# Patient Record
Sex: Female | Born: 1940 | Hispanic: Yes | Marital: Married | Smoking: Never smoker
Health system: Southern US, Community
[De-identification: ages and names within clinical notes are randomized; demographics above are authoritative.]

## PROBLEM LIST (undated history)

## (undated) ENCOUNTER — Emergency Department (HOSPITAL_COMMUNITY): Source: Home / Self Care

## (undated) DIAGNOSIS — M1990 Unspecified osteoarthritis, unspecified site: Secondary | ICD-10-CM

## (undated) DIAGNOSIS — M810 Age-related osteoporosis without current pathological fracture: Secondary | ICD-10-CM

## (undated) DIAGNOSIS — I10 Essential (primary) hypertension: Secondary | ICD-10-CM

## (undated) DIAGNOSIS — Z972 Presence of dental prosthetic device (complete) (partial): Secondary | ICD-10-CM

## (undated) DIAGNOSIS — E785 Hyperlipidemia, unspecified: Secondary | ICD-10-CM

## (undated) HISTORY — PX: CHOLECYSTECTOMY: SHX55

## (undated) HISTORY — DX: Essential (primary) hypertension: I10

## (undated) HISTORY — DX: Age-related osteoporosis without current pathological fracture: M81.0

## (undated) HISTORY — PX: VARICOSE VEIN SURGERY: SHX832

---

## 1998-12-01 HISTORY — PX: COLONOSCOPY: SHX174

## 2009-07-17 DIAGNOSIS — M8580 Other specified disorders of bone density and structure, unspecified site: Secondary | ICD-10-CM | POA: Insufficient documentation

## 2009-07-17 DIAGNOSIS — M154 Erosive (osteo)arthritis: Secondary | ICD-10-CM | POA: Insufficient documentation

## 2012-06-29 ENCOUNTER — Other Ambulatory Visit (HOSPITAL_COMMUNITY): Payer: Self-pay | Admitting: Nurse Practitioner

## 2012-06-29 DIAGNOSIS — N6452 Nipple discharge: Secondary | ICD-10-CM

## 2012-06-29 DIAGNOSIS — Z139 Encounter for screening, unspecified: Secondary | ICD-10-CM

## 2012-07-05 ENCOUNTER — Ambulatory Visit (HOSPITAL_COMMUNITY): Payer: Self-pay

## 2012-07-13 ENCOUNTER — Ambulatory Visit (HOSPITAL_COMMUNITY)
Admission: RE | Admit: 2012-07-13 | Discharge: 2012-07-13 | Disposition: A | Discharge status: Home / Self Care | Payer: Self-pay | Source: Ambulatory Visit | Attending: Nurse Practitioner | Admitting: Nurse Practitioner

## 2012-07-13 DIAGNOSIS — Z139 Encounter for screening, unspecified: Secondary | ICD-10-CM

## 2012-07-21 ENCOUNTER — Encounter (HOSPITAL_COMMUNITY): Payer: Self-pay

## 2014-08-28 ENCOUNTER — Encounter: Payer: Self-pay | Admitting: *Deleted

## 2014-09-20 ENCOUNTER — Ambulatory Visit: Payer: Self-pay | Admitting: General Surgery

## 2014-11-13 ENCOUNTER — Encounter: Payer: Self-pay | Admitting: *Deleted

## 2014-11-13 ENCOUNTER — Ambulatory Visit (INDEPENDENT_AMBULATORY_CARE_PROVIDER_SITE_OTHER): Payer: Medicare Other | Admitting: General Surgery

## 2014-11-13 ENCOUNTER — Other Ambulatory Visit: Payer: Self-pay | Admitting: *Deleted

## 2014-11-13 ENCOUNTER — Encounter: Payer: Self-pay | Admitting: General Surgery

## 2014-11-13 VITALS — BP 120/70 | HR 74 | Resp 12 | Ht <= 58 in | Wt 122.0 lb

## 2014-11-13 DIAGNOSIS — Z1211 Encounter for screening for malignant neoplasm of colon: Secondary | ICD-10-CM

## 2014-11-13 MED ORDER — POLYETHYLENE GLYCOL 3350 17 GM/SCOOP PO POWD: ORAL | Status: DC

## 2014-11-13 NOTE — Addendum Note (Signed)
Addended by: Kieth BrightlySANKAR, SEEPLAPUTHUR G on: 11/13/2014 11:18 AM   Modules accepted: Orders

## 2014-11-13 NOTE — Progress Notes (Signed)
Patient ID: Jacqueline Matthews, female   DOB: 1941/07/05, 73 y.o.   MRN: [id number]  Patient has been scheduled for a colonoscopy on 11-22-14 at [facility].   Spanish Interpreter, Jacqueline Matthews, was present to review instructions today. Patient verbalizes understanding.

## 2014-11-13 NOTE — Progress Notes (Signed)
Patient ID: Jacqueline Matthews, female   DOB: 05-Jun-1941, 73 y.o.   MRN: [id number]  Patient states she is taking Pravastatin 40 mg one tablet at bedtime. Also, on a multivitamin daily.   Medication list has been updated accordingly.

## 2014-11-13 NOTE — Progress Notes (Signed)
Patient ID: Royetta CrochetLuisa Naumann, female   DOB: June 15, 1941, 73 y.o.   MRN: [id number]  Chief Complaint  Patient presents with  . Other    screening colonoscopy    HPI Royetta CrochetLuisa Calamari is a 73 y.o. female here today for a evaluation of a screening colonoscopy. Patient states no Gi problems at this time. [place].No polys found .  Interpreter Trudy [facility] present for whole duration of visit  HPI  Past Medical History  Diagnosis Date  . Hypertension   . Osteoporosis     Past Surgical History  Procedure Laterality Date  . Cholecystectomy    . Colonoscopy  2000    [place]    History reviewed. No pertinent family history.  Social History History  Substance Use Topics  . Smoking status: Never Smoker   . Smokeless tobacco: Not on file  . Alcohol Use: No    No Known Allergies  Current Outpatient Prescriptions  Medication Sig Dispense Refill  . acetaminophen (TYLENOL) 500 MG tablet Take 500 mg by mouth every 6 (six) hours as needed.    . Cranberry 125 MG TABS Take by mouth.    . meloxicam (MOBIC) 7.5 MG tablet Take 7.5 mg by mouth daily.     No current facility-administered medications for this visit.    Review of Systems Review of Systems  Constitutional: Negative.   Respiratory: Negative.   Cardiovascular: Negative.   Gastrointestinal: Negative.     Blood pressure 120/70, pulse 74, resp. rate 12, height 4\' 10"  (1.473 m), weight 122 lb (55.339 kg).  Physical Exam Physical Exam  Constitutional: She is oriented to person, place, and time. Vital signs are normal. She appears well-developed and well-nourished.  Very pleasant female.  Eyes: Conjunctivae are normal. No scleral icterus.  Neck: Neck supple.  Cardiovascular: Normal rate, regular rhythm and normal heart sounds.   Pulmonary/Chest: Effort normal and breath sounds normal.  Abdominal: Soft. Bowel sounds are normal. There is no tenderness.  Lymphadenopathy:    She has no cervical  adenopathy.  Neurological: She is alert and oriented to person, place, and time.  Skin: Skin is warm and dry.    Data Reviewed Noted reviewed  Assessment   Need for screening colonoscopy Discussed role of screening colonoscopy, procedure and risks/benfits. Pt is willing to proceed with it.      Plan    Patient to be scheduled for a screening colonoscopy.       SANKAR,SEEPLAPUTHUR G 11/13/2014, 10:14 AM

## 2014-11-13 NOTE — Patient Instructions (Signed)
Colonoscopy  A colonoscopy is an exam to look at the entire large intestine (colon). This exam can help find problems such as tumors, polyps, inflammation, and areas of bleeding. The exam takes about 1 hour.   LET YOUR HEALTH CARE PROVIDER KNOW ABOUT:   · Any allergies you have.  · All medicines you are taking, including vitamins, herbs, eye drops, creams, and over-the-counter medicines.  · Previous problems you or members of your family have had with the use of anesthetics.  · Any blood disorders you have.  · Previous surgeries you have had.  · Medical conditions you have.  RISKS AND COMPLICATIONS   Generally, this is a safe procedure. However, as with any procedure, complications can occur. Possible complications include:  · Bleeding.  · Tearing or rupture of the colon wall.  · Reaction to medicines given during the exam.  · Infection (rare).  BEFORE THE PROCEDURE   · Ask your health care provider about changing or stopping your regular medicines.  · You may be prescribed an oral bowel prep. This involves drinking a large amount of medicated liquid, starting the day before your procedure. The liquid will cause you to have multiple loose stools until your stool is almost clear or light green. This cleans out your colon in preparation for the procedure.  · Do not eat or drink anything else once you have started the bowel prep, unless your health care provider tells you it is safe to do so.  · Arrange for someone to drive you home after the procedure.  PROCEDURE   · You will be given medicine to help you relax (sedative).  · You will lie on your side with your knees bent.  · A long, flexible tube with a light and camera on the end (colonoscope) will be inserted through the rectum and into the colon. The camera sends video back to a computer screen as it moves through the colon. The colonoscope also releases carbon dioxide gas to inflate the colon. This helps your health care provider see the area better.  · During  the exam, your health care provider may take a small tissue sample (biopsy) to be examined under a microscope if any abnormalities are found.  · The exam is finished when the entire colon has been viewed.  AFTER THE PROCEDURE   · Do not drive for 24 hours after the exam.  · You may have a small amount of blood in your stool.  · You may pass moderate amounts of gas and have mild abdominal cramping or bloating. This is caused by the gas used to inflate your colon during the exam.  · Ask when your test results will be ready and how you will get your results. Make sure you get your test results.  Document Released: 11/14/2000 Document Revised: 09/07/2013 Document Reviewed: 07/25/2013  ExitCare® Patient Information ©2015 [employer]. This information is not intended to replace advice given to you by your health care provider. Make sure you discuss any questions you have with your health care provider.

## 2014-11-22 ENCOUNTER — Ambulatory Visit: Payer: Self-pay | Admitting: General Surgery

## 2014-11-22 DIAGNOSIS — K640 First degree hemorrhoids: Secondary | ICD-10-CM

## 2014-11-22 DIAGNOSIS — K5730 Diverticulosis of large intestine without perforation or abscess without bleeding: Secondary | ICD-10-CM

## 2014-11-22 DIAGNOSIS — Z1211 Encounter for screening for malignant neoplasm of colon: Secondary | ICD-10-CM

## 2014-11-27 ENCOUNTER — Encounter: Payer: Self-pay | Admitting: General Surgery

## 2014-12-29 ENCOUNTER — Ambulatory Visit: Payer: Self-pay | Admitting: Family Medicine

## 2015-01-19 ENCOUNTER — Ambulatory Visit: Payer: Self-pay | Admitting: Family Medicine

## 2015-04-23 DIAGNOSIS — F419 Anxiety disorder, unspecified: Secondary | ICD-10-CM | POA: Insufficient documentation

## 2016-01-01 DIAGNOSIS — F4310 Post-traumatic stress disorder, unspecified: Secondary | ICD-10-CM | POA: Insufficient documentation

## 2016-01-31 ENCOUNTER — Other Ambulatory Visit: Payer: Self-pay | Admitting: Physician Assistant

## 2016-01-31 DIAGNOSIS — R1031 Right lower quadrant pain: Secondary | ICD-10-CM

## 2016-01-31 DIAGNOSIS — R140 Abdominal distension (gaseous): Secondary | ICD-10-CM

## 2016-02-15 ENCOUNTER — Ambulatory Visit
Admission: RE | Admit: 2016-02-15 | Discharge: 2016-02-15 | Disposition: A | Discharge status: Home / Self Care | Payer: Medicare Other | Source: Ambulatory Visit | Attending: Physician Assistant | Admitting: Physician Assistant

## 2016-02-15 DIAGNOSIS — R1031 Right lower quadrant pain: Secondary | ICD-10-CM | POA: Diagnosis present

## 2016-02-15 DIAGNOSIS — R140 Abdominal distension (gaseous): Secondary | ICD-10-CM | POA: Insufficient documentation

## 2016-11-12 ENCOUNTER — Other Ambulatory Visit: Payer: Self-pay | Admitting: Physician Assistant

## 2016-11-12 DIAGNOSIS — M810 Age-related osteoporosis without current pathological fracture: Secondary | ICD-10-CM

## 2016-11-12 DIAGNOSIS — N6459 Other signs and symptoms in breast: Secondary | ICD-10-CM

## 2016-11-13 ENCOUNTER — Other Ambulatory Visit: Payer: Self-pay | Admitting: Physician Assistant

## 2016-11-13 DIAGNOSIS — R1031 Right lower quadrant pain: Secondary | ICD-10-CM

## 2016-11-26 ENCOUNTER — Other Ambulatory Visit: Payer: Self-pay | Admitting: Obstetrics and Gynecology

## 2016-11-26 ENCOUNTER — Other Ambulatory Visit: Payer: Self-pay | Admitting: Physician Assistant

## 2016-11-26 DIAGNOSIS — Z1231 Encounter for screening mammogram for malignant neoplasm of breast: Secondary | ICD-10-CM

## 2016-12-04 ENCOUNTER — Other Ambulatory Visit: Payer: Self-pay | Admitting: Physician Assistant

## 2016-12-04 DIAGNOSIS — K4090 Unilateral inguinal hernia, without obstruction or gangrene, not specified as recurrent: Secondary | ICD-10-CM

## 2016-12-04 DIAGNOSIS — R1031 Right lower quadrant pain: Secondary | ICD-10-CM

## 2016-12-05 ENCOUNTER — Ambulatory Visit
Admission: RE | Admit: 2016-12-05 | Discharge: 2016-12-05 | Disposition: A | Discharge status: Home / Self Care | Payer: Medicare Other | Source: Ambulatory Visit | Attending: Physician Assistant | Admitting: Physician Assistant

## 2016-12-05 DIAGNOSIS — R1031 Right lower quadrant pain: Secondary | ICD-10-CM

## 2016-12-05 DIAGNOSIS — K4090 Unilateral inguinal hernia, without obstruction or gangrene, not specified as recurrent: Secondary | ICD-10-CM

## 2016-12-08 ENCOUNTER — Other Ambulatory Visit: Payer: Self-pay | Admitting: Physician Assistant

## 2016-12-08 DIAGNOSIS — R1903 Right lower quadrant abdominal swelling, mass and lump: Secondary | ICD-10-CM

## 2016-12-08 DIAGNOSIS — R1031 Right lower quadrant pain: Secondary | ICD-10-CM

## 2016-12-10 ENCOUNTER — Ambulatory Visit
Admission: RE | Admit: 2016-12-10 | Discharge: 2016-12-10 | Disposition: A | Discharge status: Home / Self Care | Payer: Medicare Other | Source: Ambulatory Visit | Attending: Physician Assistant | Admitting: Physician Assistant

## 2016-12-10 DIAGNOSIS — R1903 Right lower quadrant abdominal swelling, mass and lump: Secondary | ICD-10-CM

## 2016-12-10 DIAGNOSIS — R1031 Right lower quadrant pain: Secondary | ICD-10-CM | POA: Diagnosis present

## 2016-12-11 ENCOUNTER — Ambulatory Visit
Admission: RE | Admit: 2016-12-11 | Discharge: 2016-12-11 | Disposition: A | Discharge status: Home / Self Care | Payer: Medicare Other | Source: Ambulatory Visit | Attending: Obstetrics and Gynecology | Admitting: Obstetrics and Gynecology

## 2016-12-11 ENCOUNTER — Ambulatory Visit
Admission: RE | Admit: 2016-12-11 | Discharge: 2016-12-11 | Disposition: A | Discharge status: Home / Self Care | Payer: Medicare Other | Source: Ambulatory Visit | Attending: Physician Assistant | Admitting: Physician Assistant

## 2016-12-11 DIAGNOSIS — M8588 Other specified disorders of bone density and structure, other site: Secondary | ICD-10-CM | POA: Insufficient documentation

## 2016-12-11 DIAGNOSIS — N6459 Other signs and symptoms in breast: Secondary | ICD-10-CM

## 2016-12-11 DIAGNOSIS — Z1231 Encounter for screening mammogram for malignant neoplasm of breast: Secondary | ICD-10-CM

## 2016-12-11 DIAGNOSIS — M810 Age-related osteoporosis without current pathological fracture: Secondary | ICD-10-CM

## 2016-12-11 DIAGNOSIS — Z87898 Personal history of other specified conditions: Secondary | ICD-10-CM | POA: Diagnosis present

## 2016-12-26 ENCOUNTER — Encounter
Admission: RE | Admit: 2016-12-26 | Discharge: 2016-12-26 | Disposition: A | Discharge status: Home / Self Care | Payer: Medicare Other | Source: Ambulatory Visit | Attending: Surgery | Admitting: Surgery

## 2016-12-26 DIAGNOSIS — R799 Abnormal finding of blood chemistry, unspecified: Secondary | ICD-10-CM | POA: Diagnosis not present

## 2016-12-26 DIAGNOSIS — Z01810 Encounter for preprocedural cardiovascular examination: Secondary | ICD-10-CM | POA: Diagnosis present

## 2016-12-26 DIAGNOSIS — Z01812 Encounter for preprocedural laboratory examination: Secondary | ICD-10-CM | POA: Diagnosis not present

## 2016-12-26 HISTORY — DX: Hyperlipidemia, unspecified: E78.5

## 2016-12-26 LAB — COMPREHENSIVE METABOLIC PANEL
ALT: 19 U/L (ref 14–54)
AST: 23 U/L (ref 15–41)
Albumin: 4.5 g/dL (ref 3.5–5.0)
Alkaline Phosphatase: 73 U/L (ref 38–126)
Anion gap: 6 (ref 5–15)
BUN: 10 mg/dL (ref 6–20)
CHLORIDE: 104 mmol/L (ref 101–111)
CO2: 30 mmol/L (ref 22–32)
Calcium: 9.9 mg/dL (ref 8.9–10.3)
Creatinine, Ser: 0.6 mg/dL (ref 0.44–1.00)
GFR calc Af Amer: >60 mL/min (ref >60)
Glucose, Bld: 80 mg/dL (ref 65–99)
POTASSIUM: 4.1 mmol/L (ref 3.5–5.1)
Sodium: 140 mmol/L (ref 135–145)
Total Bilirubin: 0.7 mg/dL (ref 0.3–1.2)
Total Protein: 7.9 g/dL (ref 6.5–8.1)

## 2016-12-26 LAB — CBC
HCT: 42.3 % (ref 35.0–47.0)
Hemoglobin: 13.6 g/dL (ref 12.0–16.0)
MCH: 27.7 pg (ref 26.0–34.0)
MCHC: 32.2 g/dL (ref 32.0–36.0)
MCV: 86 fL (ref 80.0–100.0)
PLATELETS: 256 10*3/uL (ref 150–440)
RBC: 4.92 MIL/uL (ref 3.80–5.20)
RDW: 15.5 % — AB (ref 11.5–14.5)
WBC: 7.8 10*3/uL (ref 3.6–11.0)

## 2016-12-26 NOTE — Patient Instructions (Signed)
Your procedure is scheduled on: 12/30/16 Tues Su procedimiento est programado para: Report to 2nd floor medical mall Presntese a: To find out your arrival time please call [phone] between 1PM - 3PM on 12/29/16. Para saber su hora de llegada por favor llame al [phone] entre la 1PM - 3PM el da:  Remember: Instructions that are not followed completely may result in serious medical risk, up to and including death, or upon the discretion of your surgeon and anesthesiologist your surgery may need to be rescheduled.  Recuerde: Las instrucciones que no se siguen completamente Armed forces logistics/support/administrative officer en un riesgo de salud grave, incluyendo hasta la [place] o a discrecin de su cirujano y Scientific laboratory technician, su ciruga se puede posponer.   __x__ 1. Do not eat food or drink liquids after midnight. No gum chewing or hard candies.  No coma alimentos ni tome lquidos despus de la medianoche.  No mastique chicle ni caramelos  duros.     _x___ 2. No alcohol for 24 hours before or after surgery.    No tome alcohol durante las 24 horas antes ni despus de la [place].   ____ 3. Bring all medications with you on the day of surgery if instructed.    Lleve todos los medicamentos con usted el da de su ciruga si se le ha indicado as.   _x___ 4. Notify your doctor if there is any change in your medical condition (cold, fever,                             infections).    Informe a su mdico si hay algn cambio en su condicin mdica (resfriado, fiebre, infecciones).   Do not wear jewelry, make-up, hairpins, clips or nail polish.  No use joyas, maquillajes, pinzas/ganchos para el cabello ni esmalte de uas.  Do not wear lotions, powders, or perfumes. You may wear deodorant.  No use lociones, polvos o perfumes.  Puede usar desodorante.    Do not shave 48 hours prior to surgery. Men may shave face and neck.  No se afeite 48 horas antes de la [place].  Los hombres pueden [employer] cara y el cuello.   Do not  bring valuables to the hospital.   No lleve objetos de valor al hospital.  [facility] is not responsible for any belongings or valuables.  [place] no se hace responsable de ningn tipo de pertenencias u objetos de Licensed conveyancer.               Contacts, dentures or bridgework may not be worn into surgery.  Los lentes de [place], las dentaduras postizas o puentes no se pueden usar en la [place].  Leave your suitcase in the car. After surgery it may be brought to your room.  Deje su maleta en el auto.  Despus de la ciruga podr traerla a su habitacin.  For patients admitted to the hospital, discharge time is determined by your treatment team.  Para los pacientes que sean ingresados al hospital, el tiempo en el cual se le dar de alta es determinado por su                equipo de [place].   Patients discharged the day of surgery will not be allowed to drive home. A los pacientes que se les da de alta el mismo da de la ciruga no se les permitir conducir a Higher education careers adviser.   Please read over the following fact sheets  that you were given: Por favor lea las siguientes hojas de informacin que le dieron:      _x___ Take these medicines the morning of surgery with A SIP OF WATER:          Owens-Illinoisome estas medicinas la maana de la ciruga con UN SORBO DE AGUA:  1. None  2.   3.   4.       5.  6.  ____ Fleet Enema (as directed)          Enema de Fleet (segn lo indicado)    _x___ Use CHG Soap as directed          Utilice el jabn de CHG segn lo indicado  ____ Use inhalers on the day of surgery          Use los inhaladores el da de la ciruga  ____ Stop metformin 2 days prior to surgery          Deje de tomar el metformin 2 das antes de la ciruga    ____ Take 1/2 of usual insulin dose the night before surgery and none on the morning of surgery           Tome la mitad de la dosis habitual de insulina la noche antes de la [place] y no tome nada en la maana de la             ciruga  ____  Stop Coumadin/Plavix/aspirin on           Deje de tomar el Coumadin/Plavix/aspirina el da:  __x__ Stop Anti-inflammatories on Stop Meloxicam til after surgery          Deje de tomar antiinflamatorios el da:   ____ Stop supplements until after surgery            Deje de tomar suplementos hasta despus de la ciruga  ____ Bring C-Pap to the hospital          Lleve el C-Pap al hospital

## 2016-12-30 ENCOUNTER — Encounter: Payer: Self-pay | Admitting: *Deleted

## 2016-12-30 ENCOUNTER — Ambulatory Visit: Payer: Medicare Other | Admitting: Anesthesiology

## 2016-12-30 ENCOUNTER — Ambulatory Visit
Admission: RE | Admit: 2016-12-30 | Discharge: 2016-12-30 | Disposition: A | Discharge status: Home / Self Care | Payer: Medicare Other | Source: Ambulatory Visit | Attending: Surgery | Admitting: Surgery

## 2016-12-30 ENCOUNTER — Encounter
Admission: RE | Disposition: A | Discharge status: Home / Self Care | Payer: Self-pay | Source: Ambulatory Visit | Attending: Surgery

## 2016-12-30 DIAGNOSIS — I10 Essential (primary) hypertension: Secondary | ICD-10-CM | POA: Insufficient documentation

## 2016-12-30 DIAGNOSIS — M810 Age-related osteoporosis without current pathological fracture: Secondary | ICD-10-CM | POA: Insufficient documentation

## 2016-12-30 DIAGNOSIS — K4190 Unilateral femoral hernia, without obstruction or gangrene, not specified as recurrent: Secondary | ICD-10-CM | POA: Diagnosis not present

## 2016-12-30 DIAGNOSIS — K4090 Unilateral inguinal hernia, without obstruction or gangrene, not specified as recurrent: Secondary | ICD-10-CM | POA: Diagnosis present

## 2016-12-30 HISTORY — PX: INGUINAL HERNIA REPAIR: SHX194

## 2016-12-30 SURGERY — REPAIR, HERNIA, INGUINAL, ADULT
Anesthesia: General | Laterality: Right | Wound class: Clean

## 2016-12-30 MED ORDER — DEXAMETHASONE SODIUM PHOSPHATE 10 MG/ML IJ SOLN
INTRAMUSCULAR | Status: AC
  Filled 2016-12-30: qty 1

## 2016-12-30 MED ORDER — EPINEPHRINE PF 1 MG/ML IJ SOLN
INTRAMUSCULAR | Status: AC
  Filled 2016-12-30: qty 1

## 2016-12-30 MED ORDER — FENTANYL CITRATE (PF) 100 MCG/2ML IJ SOLN
INTRAMUSCULAR | Status: DC | PRN
  Administered 2016-12-30 (×3): 25 ug via INTRAVENOUS

## 2016-12-30 MED ORDER — LIDOCAINE HCL (CARDIAC) 20 MG/ML IV SOLN
INTRAVENOUS | Status: DC | PRN
  Administered 2016-12-30: 60 mg via INTRAVENOUS

## 2016-12-30 MED ORDER — LIDOCAINE HCL (PF) 2 % IJ SOLN
INTRAMUSCULAR | Status: AC
  Filled 2016-12-30: qty 2

## 2016-12-30 MED ORDER — SUCCINYLCHOLINE CHLORIDE 200 MG/10ML IV SOSY
PREFILLED_SYRINGE | INTRAVENOUS | Status: AC
  Filled 2016-12-30: qty 10

## 2016-12-30 MED ORDER — SUCCINYLCHOLINE CHLORIDE 20 MG/ML IJ SOLN
INTRAMUSCULAR | Status: DC | PRN
  Administered 2016-12-30: 70 mg via INTRAVENOUS

## 2016-12-30 MED ORDER — FENTANYL CITRATE (PF) 100 MCG/2ML IJ SOLN
25.0000 ug | INTRAMUSCULAR | Status: DC | PRN
  Administered 2016-12-30 (×4): 25 ug via INTRAVENOUS

## 2016-12-30 MED ORDER — HYDROCODONE-ACETAMINOPHEN 5-325 MG PO TABS
1.0000 | ORAL_TABLET | ORAL | Status: DC | PRN
  Administered 2016-12-30: 1 via ORAL

## 2016-12-30 MED ORDER — CEFAZOLIN SODIUM-DEXTROSE 2-4 GM/100ML-% IV SOLN
INTRAVENOUS | Status: AC
  Filled 2016-12-30: qty 100

## 2016-12-30 MED ORDER — DEXAMETHASONE SODIUM PHOSPHATE 10 MG/ML IJ SOLN
INTRAMUSCULAR | Status: DC | PRN
  Administered 2016-12-30: 75 mg via INTRAVENOUS

## 2016-12-30 MED ORDER — FAMOTIDINE 20 MG PO TABS
ORAL_TABLET | ORAL | Status: AC
  Filled 2016-12-30: qty 1

## 2016-12-30 MED ORDER — HYDROCODONE-ACETAMINOPHEN 5-325 MG PO TABS: 1.0000 | ORAL_TABLET | ORAL | 0 refills | Status: DC | PRN

## 2016-12-30 MED ORDER — FAMOTIDINE 20 MG PO TABS
20.0000 mg | ORAL_TABLET | Freq: Once | ORAL | Status: AC
  Administered 2016-12-30: 20 mg via ORAL

## 2016-12-30 MED ORDER — FENTANYL CITRATE (PF) 100 MCG/2ML IJ SOLN
INTRAMUSCULAR | Status: AC
  Filled 2016-12-30: qty 2

## 2016-12-30 MED ORDER — SUGAMMADEX SODIUM 200 MG/2ML IV SOLN
INTRAVENOUS | Status: AC
  Filled 2016-12-30: qty 2

## 2016-12-30 MED ORDER — BUPIVACAINE HCL (PF) 0.5 % IJ SOLN
INTRAMUSCULAR | Status: AC
  Filled 2016-12-30: qty 30

## 2016-12-30 MED ORDER — ONDANSETRON HCL 4 MG/2ML IJ SOLN
INTRAMUSCULAR | Status: DC | PRN
  Administered 2016-12-30: 4 mg via INTRAVENOUS

## 2016-12-30 MED ORDER — PROPOFOL 10 MG/ML IV BOLUS
INTRAVENOUS | Status: DC | PRN
  Administered 2016-12-30: 80 mg via INTRAVENOUS

## 2016-12-30 MED ORDER — BUPIVACAINE-EPINEPHRINE (PF) 0.5% -1:200000 IJ SOLN
INTRAMUSCULAR | Status: DC | PRN
  Administered 2016-12-30: 10 mL via PERINEURAL

## 2016-12-30 MED ORDER — CEFAZOLIN SODIUM-DEXTROSE 2-4 GM/100ML-% IV SOLN
2.0000 g | Freq: Once | INTRAVENOUS | Status: AC
  Administered 2016-12-30: 2 g via INTRAVENOUS

## 2016-12-30 MED ORDER — FENTANYL CITRATE (PF) 100 MCG/2ML IJ SOLN
INTRAMUSCULAR | Status: AC
  Administered 2016-12-30: 25 ug via INTRAVENOUS
  Filled 2016-12-30: qty 2

## 2016-12-30 MED ORDER — ONDANSETRON HCL 4 MG/2ML IJ SOLN
INTRAMUSCULAR | Status: AC
  Filled 2016-12-30: qty 2

## 2016-12-30 MED ORDER — SUGAMMADEX SODIUM 200 MG/2ML IV SOLN
INTRAVENOUS | Status: DC | PRN
  Administered 2016-12-30: 100 mg via INTRAVENOUS

## 2016-12-30 MED ORDER — PROPOFOL 10 MG/ML IV BOLUS
INTRAVENOUS | Status: AC
  Filled 2016-12-30: qty 20

## 2016-12-30 MED ORDER — ONDANSETRON HCL 4 MG/2ML IJ SOLN: 4.0000 mg | Freq: Once | INTRAMUSCULAR | Status: DC | PRN

## 2016-12-30 MED ORDER — ROCURONIUM BROMIDE 50 MG/5ML IV SOSY
PREFILLED_SYRINGE | INTRAVENOUS | Status: AC
  Filled 2016-12-30: qty 5

## 2016-12-30 MED ORDER — LACTATED RINGERS IV SOLN
INTRAVENOUS | Status: DC
  Administered 2016-12-30: 10:00:00 via INTRAVENOUS

## 2016-12-30 MED ORDER — ROCURONIUM BROMIDE 100 MG/10ML IV SOLN
INTRAVENOUS | Status: DC | PRN
  Administered 2016-12-30: 20 mg via INTRAVENOUS

## 2016-12-30 MED ORDER — HYDROCODONE-ACETAMINOPHEN 5-325 MG PO TABS
ORAL_TABLET | ORAL | Status: AC
  Filled 2016-12-30: qty 1

## 2016-12-30 SURGICAL SUPPLY — 25 items
BLADE SURG 15 STRL LF DISP TIS (BLADE) ×1 IMPLANT
BLADE SURG 15 STRL SS (BLADE) ×1
CANISTER SUCT 1200ML W/VALVE (MISCELLANEOUS) ×2 IMPLANT
CHLORAPREP W/TINT 26ML (MISCELLANEOUS) ×2 IMPLANT
DERMABOND ADVANCED (GAUZE/BANDAGES/DRESSINGS) ×1
DERMABOND ADVANCED .7 DNX12 (GAUZE/BANDAGES/DRESSINGS) ×1 IMPLANT
DRAIN PENROSE 5/8X18 LTX STRL (WOUND CARE) ×2 IMPLANT
DRAPE LAPAROTOMY 77X122 PED (DRAPES) ×2 IMPLANT
ELECT REM PT RETURN 9FT ADLT (ELECTROSURGICAL) ×2
ELECTRODE REM PT RTRN 9FT ADLT (ELECTROSURGICAL) ×1 IMPLANT
GLOVE BIO SURGEON STRL SZ7.5 (GLOVE) ×2 IMPLANT
GOWN STRL REUS W/ TWL LRG LVL3 (GOWN DISPOSABLE) ×3 IMPLANT
GOWN STRL REUS W/TWL LRG LVL3 (GOWN DISPOSABLE) ×3
KIT RM TURNOVER STRD PROC AR (KITS) ×2 IMPLANT
LABEL OR SOLS (LABEL) ×2 IMPLANT
LIQUID BAND (GAUZE/BANDAGES/DRESSINGS) ×2 IMPLANT
NEEDLE HYPO 25X1 1.5 SAFETY (NEEDLE) ×2 IMPLANT
NS IRRIG 500ML POUR BTL (IV SOLUTION) ×2 IMPLANT
PACK BASIN MINOR ARMC (MISCELLANEOUS) ×2 IMPLANT
SUT CHROMIC 4 0 RB 1X27 (SUTURE) ×2 IMPLANT
SUT MNCRL AB 4-0 PS2 18 (SUTURE) ×2 IMPLANT
SUT SURGILON 0 30 BLK (SUTURE) ×8 IMPLANT
SUT VIC AB 4-0 SH 27 (SUTURE) ×2
SUT VIC AB 4-0 SH 27XANBCTRL (SUTURE) ×2 IMPLANT
SYRINGE 10CC LL (SYRINGE) ×2 IMPLANT

## 2016-12-30 NOTE — Anesthesia Post-op Follow-up Note (Cosign Needed)
Anesthesia QCDR form completed.        

## 2016-12-30 NOTE — H&P (Signed)
  She comes in today prepared for right inguinal hernia repair  She reports no change in condition since the office staff.  The right side was marked YES.  I discussed the plan for right inguinal hernia repair.

## 2016-12-30 NOTE — Anesthesia Preprocedure Evaluation (Signed)
Anesthesia Evaluation  Patient identified by MRN, date of birth, ID band Patient awake    Reviewed: Allergy & Precautions, NPO status , Patient's Chart, lab work & pertinent test results  Airway Mallampati: II       Dental  (+) Upper Dentures   Pulmonary neg pulmonary ROS,    Pulmonary exam normal        Cardiovascular hypertension, Normal cardiovascular exam     Neuro/Psych negative neurological ROS  negative psych ROS   GI/Hepatic Neg liver ROS,   Endo/Other  negative endocrine ROS  Renal/GU negative Renal ROS  negative genitourinary   Musculoskeletal negative musculoskeletal ROS (+)   Abdominal Normal abdominal exam  (+)   Peds negative pediatric ROS (+)  Hematology negative hematology ROS (+)   Anesthesia Other Findings Past Medical History: No date: Elevated lipids No date: Hypertension     Comment: no longer elevated MD discontinued med No date: Osteoporosis  Reproductive/Obstetrics                             Anesthesia Physical Anesthesia Plan  ASA: II  Anesthesia Plan: General   Post-op Pain Management:    Induction: Intravenous  Airway Management Planned: Oral ETT  Additional Equipment:   Intra-op Plan:   Post-operative Plan: Extubation in OR  Informed Consent: I have reviewed the patients History and Physical, chart, labs and discussed the procedure including the risks, benefits and alternatives for the proposed anesthesia with the patient or authorized representative who has indicated his/her understanding and acceptance.   Dental advisory given  Plan Discussed with: CRNA and Surgeon  Anesthesia Plan Comments:         Anesthesia Quick Evaluation

## 2016-12-30 NOTE — Discharge Instructions (Addendum)
Take Tylenol or Norco if needed for pain. Tome Tylenol o Norco si lo necesita para Chief Technology Officerel dolor. Should not drive or do anything dangerous when taking Norco. No debe manejar ni hacer nada peligroso mientras est tomando Norco. May shower and blot dry. Se puede baar y secar con toquecitos. Avoid straining and heavy lifting. Evite hacer esfuerzo y Lexicographerlevantar cosas pesadas.      CIRUGIA AMBULATORIA       Instruccionnes de alta   1.  Las drogas que se Dispensing opticianle administraron permaneceran en su cuerpo [employer] Corporationhasta [place], asi      que por las proximas 24 horas usted no debe:   Conducir Field seismologist(manejar) un automovil   Hacer ninguna decision legal   Tomar ninguna bebida alcoholica  2.  A) [place] puede comenzar una dieta regular.  Es mejor que hoy empiece con                    liquidos y gradualmente anada [address] solidas.       B) Puede comer cualquier comida que desee pero es mejor empezar con liquidos,               luego sopitas con galletas saladas y gradualmente llegar a las comidas solidas.  3.  Por favor avise a su medico inmediatamente si usted tiene algun sangrado anormal,       tiene dificultad con la respiracion, enrojecimiento y Engineer, miningdolor en el sitio de la cirugia,     [place]ills[place]odrenaje, fiebro o dolor que se alivia con [place]ic[place].  4.  A) Su visita posoperatoria (despues de su operacion) es con el  Dr.   Date                     Time         B)  Por favor llame para hacer la cita posoperatoria.  5.  Istrucciones especificas :

## 2016-12-30 NOTE — Anesthesia Procedure Notes (Signed)
Procedure Name: Intubation Date/Time: 12/30/2016 11:06 AM Performed by: Zetta Bills Pre-anesthesia Checklist: Patient identified, Emergency Drugs available, Suction available and Patient being monitored Patient Re-evaluated:Patient Re-evaluated prior to inductionOxygen Delivery Method: Circle system utilized Preoxygenation: Pre-oxygenation with 100% oxygen Intubation Type: IV induction Ventilation: Mask ventilation without difficulty Laryngoscope Size: Mac and 3 Grade View: Grade I Tube type: Oral Tube size: 7.0 mm Number of attempts: 1 Airway Equipment and Method: Stylet Placement Confirmation: ETT inserted through vocal cords under direct vision Secured at: 20 cm Tube secured with: Tape Dental Injury: Teeth and Oropharynx as per pre-operative assessment

## 2016-12-30 NOTE — Op Note (Signed)
OPERATIVE REPORT  PREOPERATIVE  DIAGNOSIS: . Right inguinal hernia  POSTOPERATIVE DIAGNOSIS: Right femoral hernia.  PROCEDURE: . Right femoral hernia repair  ANESTHESIA:  General  SURGEON: Renda RollsWilton Smith  MD   INDICATIONS: . She has had recent bulging discomfort in the right groin. A fatty mass of tissue was demonstrated on exam in the right groin and appeared to be incarcerated. Surgery was recommended for definitive treatment.  With the patient on the operating table in the supine position under general anesthesia the right groin was prepared with ChloraPrep and draped in a sterile manner  A transversely oriented right suprapubic incision was made and carried down through subcutaneous tissues. Several small bleeding points were cauterized. A traversing vein was divided between 4-0 chromic suture ligatures. Scarpa's fascia was incised. Dissection was carried down to the external oblique aponeurosis. There was a large fatty mass of tissue which was below the inguinal ligament and was dissected free from surrounding structures finding a femoral hernia with incarceration of omentum. The location of the femoral artery was demonstrated. The defect was enlarged medially by about 8 mm and enlarged laterally by about 2 mm. With manipulation the fatty tissue and the hernia sac was reduced back into the abdominal cavity. Next the fascia was repaired by suturing the shelving edge of the inguinal ligament to the Cooper's ligament placing all the sutures prior to tying any of them. After tying these down the defect was obliterated. The deep fascia was infiltrated with half percent Sensorcaine with epinephrine. Also the subcutaneous tissues were infiltrated. Hemostasis was intact. The Scarpa's fascia was closed with interrupted 4-0 Vicryl. The skin was closed with running 4-0 Monocryl subcuticular suture and Dermabond.  The patient tolerated surgery satisfactorily and was then prepared for transfer to the  recovery room  [facility]nessWilton Smith M.D.

## 2016-12-30 NOTE — Transfer of Care (Signed)
Immediate Anesthesia Transfer of Care Note  Patient: Jacqueline Matthews  Procedure(s) Performed: Procedure(s): HERNIA REPAIR INGUINAL ADULT (Right)  Patient Location: PACU  Anesthesia Type:General  Level of Consciousness: awake  Airway & Oxygen Therapy: Patient Spontanous Breathing  Post-op Assessment: Report given to RN  Post vital signs: stable  Last Vitals:  Vitals:   12/30/16 0930 12/30/16 1230  BP: 132/70 (!) 156/74  Pulse: 82   Resp: 18   Temp: 36.7 C 36.8 C    Last Pain:  Vitals:   12/30/16 1230  TempSrc:   PainSc: Asleep     Past Medical History:  Diagnosis Date  . Elevated lipids   . Hypertension    no longer elevated MD discontinued med  . Osteoporosis    Past Medical History:  Diagnosis Date  . Elevated lipids   . Hypertension    no longer elevated MD discontinued med  . Osteoporosis    Scheduled Meds: . famotidine       Continuous Infusions: . lactated ringers 50 mL/hr at 12/30/16 0959   PRN Meds:.fentaNYL (SUBLIMAZE) injection, ondansetron (ZOFRAN) IV     Complications: No apparent anesthesia complications

## 2016-12-31 ENCOUNTER — Encounter: Payer: Self-pay | Admitting: Surgery

## 2016-12-31 NOTE — Anesthesia Postprocedure Evaluation (Signed)
Anesthesia Post Note  Patient: Royetta CrochetLuisa Lusher  Procedure(s) Performed: Procedure(s) (LRB): HERNIA REPAIR INGUINAL ADULT (Right)  Patient location during evaluation: PACU Anesthesia Type: General Level of consciousness: awake and alert and oriented Pain management: pain level controlled Vital Signs Assessment: post-procedure vital signs reviewed and stable Respiratory status: spontaneous breathing Cardiovascular status: blood pressure returned to baseline Anesthetic complications: no     Last Vitals:  Vitals:   12/30/16 1425 12/30/16 1504  BP: 132/60 (!) 121/54  Pulse: 73 74  Resp: 12 14  Temp:  37.2 C    Last Pain:  Vitals:   12/31/16 1658  TempSrc:   PainSc: 0-No pain                 Oaklee Sunga

## 2018-05-05 DIAGNOSIS — G2581 Restless legs syndrome: Secondary | ICD-10-CM | POA: Insufficient documentation

## 2018-05-05 DIAGNOSIS — J45909 Unspecified asthma, uncomplicated: Secondary | ICD-10-CM | POA: Insufficient documentation

## 2019-06-15 DIAGNOSIS — G629 Polyneuropathy, unspecified: Secondary | ICD-10-CM | POA: Insufficient documentation

## 2019-11-07 ENCOUNTER — Other Ambulatory Visit (HOSPITAL_COMMUNITY): Payer: Self-pay

## 2019-11-08 ENCOUNTER — Other Ambulatory Visit (HOSPITAL_COMMUNITY): Payer: Self-pay | Admitting: Student

## 2019-11-08 DIAGNOSIS — M8580 Other specified disorders of bone density and structure, unspecified site: Secondary | ICD-10-CM

## 2019-11-17 ENCOUNTER — Other Ambulatory Visit (HOSPITAL_COMMUNITY): Payer: Medicare Other

## 2020-01-31 ENCOUNTER — Encounter (INDEPENDENT_AMBULATORY_CARE_PROVIDER_SITE_OTHER): Payer: Self-pay

## 2020-01-31 ENCOUNTER — Other Ambulatory Visit: Payer: Self-pay

## 2020-01-31 ENCOUNTER — Ambulatory Visit
Admission: RE | Admit: 2020-01-31 | Discharge: 2020-01-31 | Disposition: A | Discharge status: Home / Self Care | Payer: Medicare Other | Source: Ambulatory Visit | Attending: Student | Admitting: Student

## 2020-01-31 DIAGNOSIS — M8580 Other specified disorders of bone density and structure, unspecified site: Secondary | ICD-10-CM | POA: Diagnosis present

## 2020-05-06 DIAGNOSIS — N6002 Solitary cyst of left breast: Secondary | ICD-10-CM | POA: Insufficient documentation

## 2020-06-25 ENCOUNTER — Inpatient Hospital Stay
Admission: RE | Admit: 2020-06-25 | Discharge: 2020-06-25 | Disposition: A | Discharge status: Home / Self Care | Payer: Self-pay | Source: Ambulatory Visit | Attending: *Deleted | Admitting: *Deleted

## 2020-06-25 ENCOUNTER — Other Ambulatory Visit: Payer: Self-pay | Admitting: *Deleted

## 2020-06-25 DIAGNOSIS — Z1231 Encounter for screening mammogram for malignant neoplasm of breast: Secondary | ICD-10-CM

## 2020-06-26 ENCOUNTER — Other Ambulatory Visit: Payer: Self-pay | Admitting: Family Medicine

## 2020-06-26 DIAGNOSIS — N6002 Solitary cyst of left breast: Secondary | ICD-10-CM

## 2020-07-05 ENCOUNTER — Ambulatory Visit
Admission: RE | Admit: 2020-07-05 | Discharge: 2020-07-05 | Disposition: A | Discharge status: Home / Self Care | Payer: Medicare Other | Source: Ambulatory Visit | Attending: Family Medicine | Admitting: Family Medicine

## 2020-07-05 ENCOUNTER — Other Ambulatory Visit: Payer: Self-pay

## 2020-07-05 DIAGNOSIS — N6002 Solitary cyst of left breast: Secondary | ICD-10-CM | POA: Diagnosis not present

## 2021-02-06 DIAGNOSIS — I868 Varicose veins of other specified sites: Secondary | ICD-10-CM | POA: Insufficient documentation

## 2021-08-21 DIAGNOSIS — L301 Dyshidrosis [pompholyx]: Secondary | ICD-10-CM | POA: Insufficient documentation

## 2022-04-01 IMAGING — MG DIGITAL DIAGNOSTIC BILAT W/ TOMO W/ CAD
8 series · 8 of 24 positions shown · non-contrast
Comparison: Previous exam(s).

CLINICAL DATA: 78-year-old female presenting for evaluation of the
left breast due to findings on a myocardial perfusion study
performed in Sunday May, 2020. The finding was described as a 3.4 x
cm left cystic breast lesion.

EXAM:
DIGITAL DIAGNOSTIC BILATERAL MAMMOGRAM WITH TOMO AND CAD

[R MLO synth-2D]
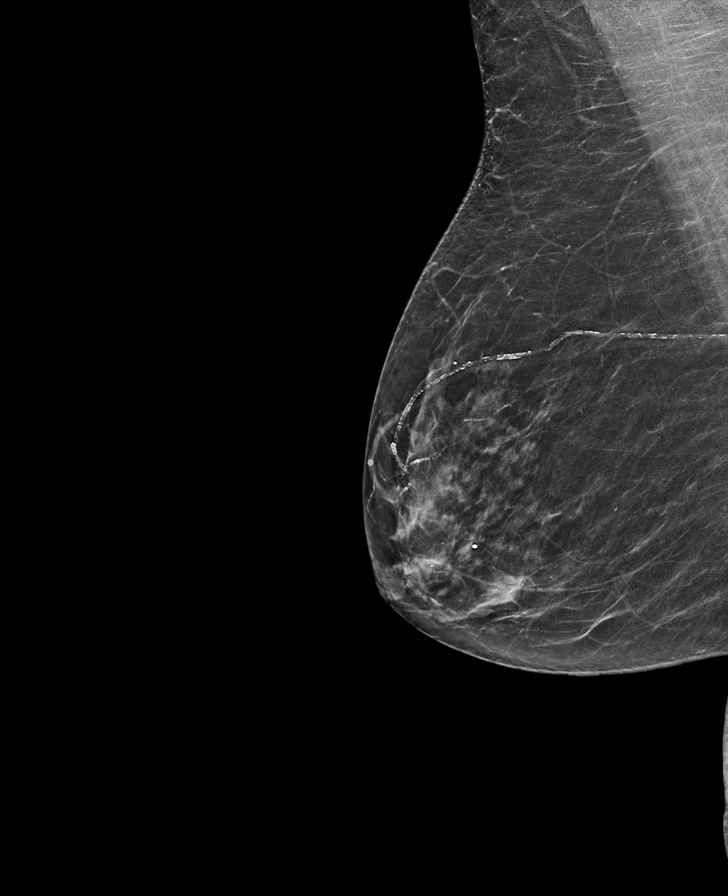

[L MLO synth-2D]
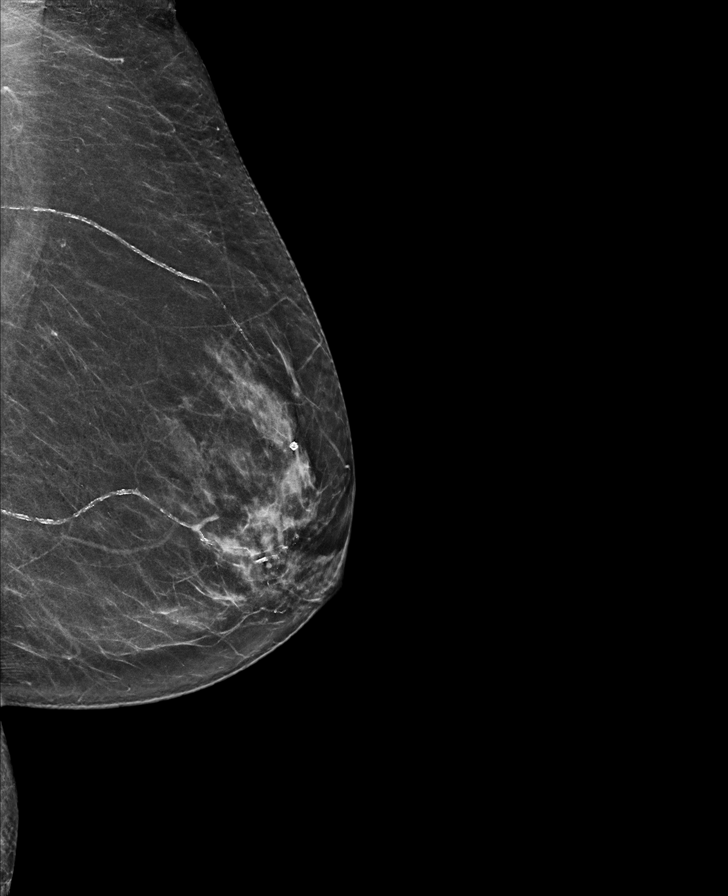

[R CC synth-2D]
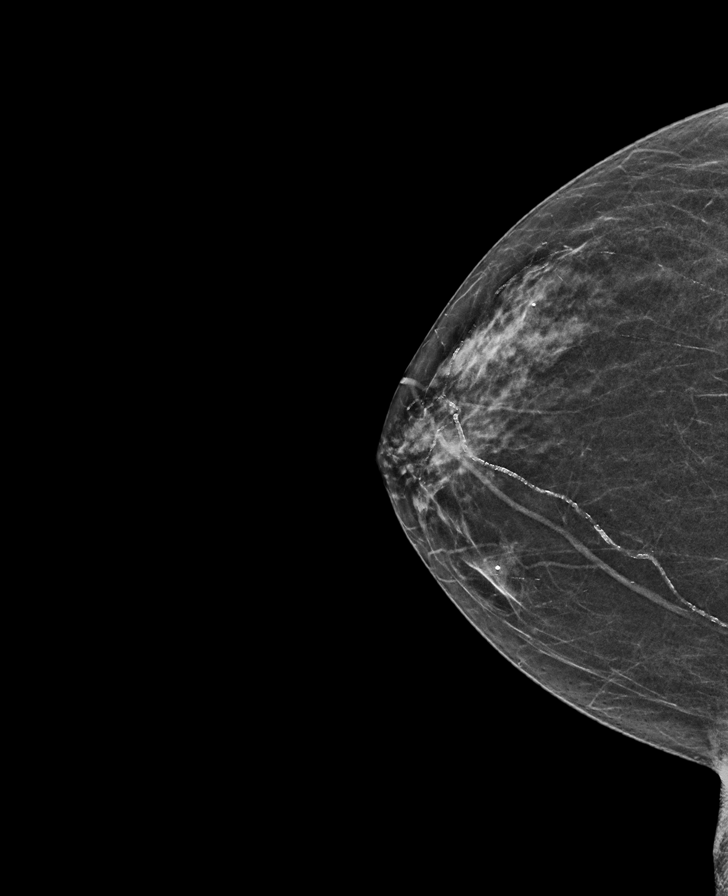

[L CC synth-2D]
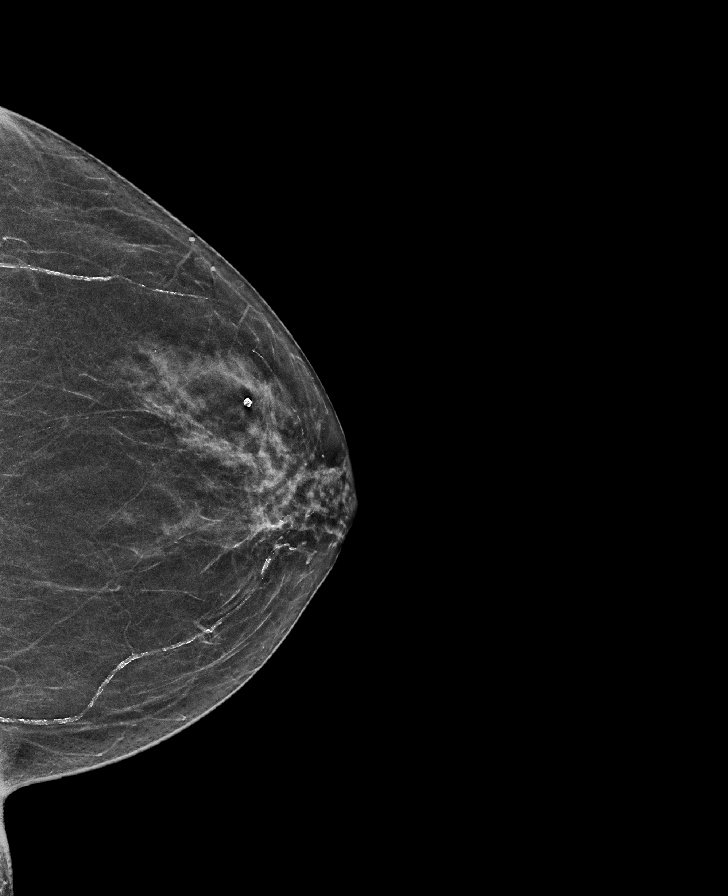

[R MLO tomo · tomo slice 29/56.0]
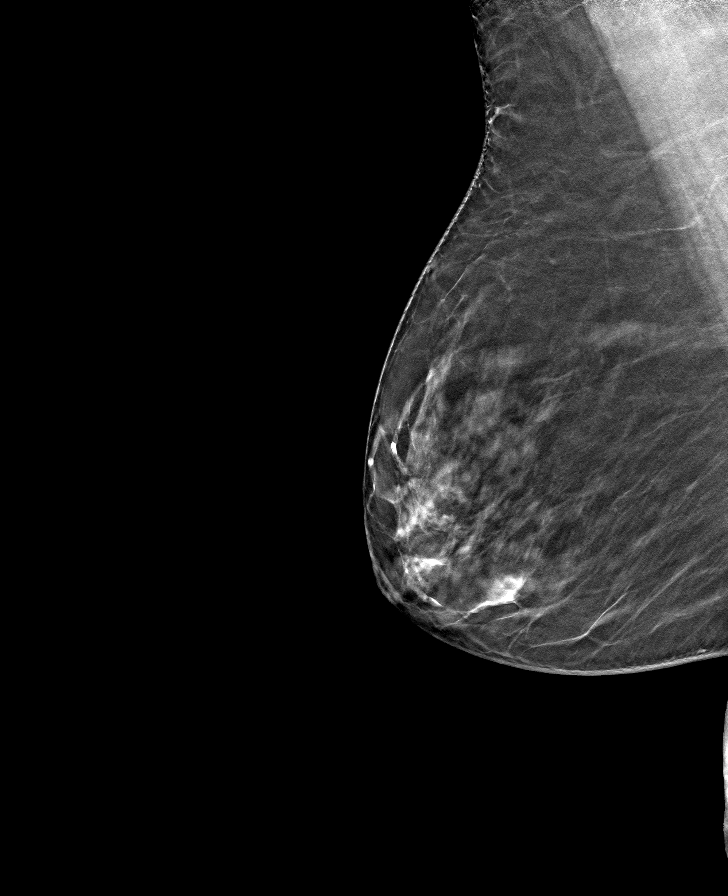

[L CC tomo · tomo slice 28/55.0]
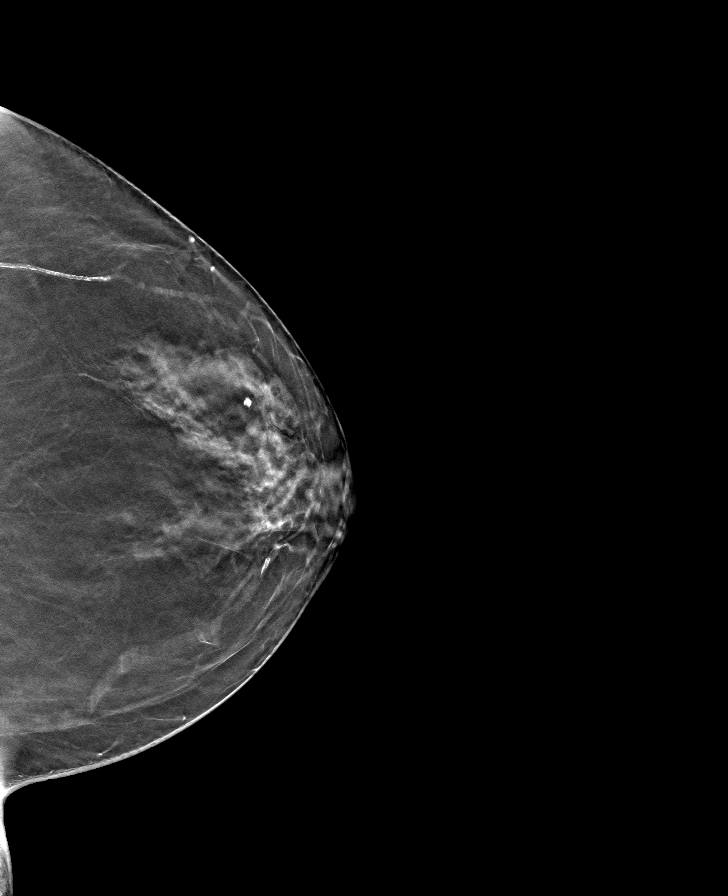

[L MLO tomo · tomo slice 32/63.0]
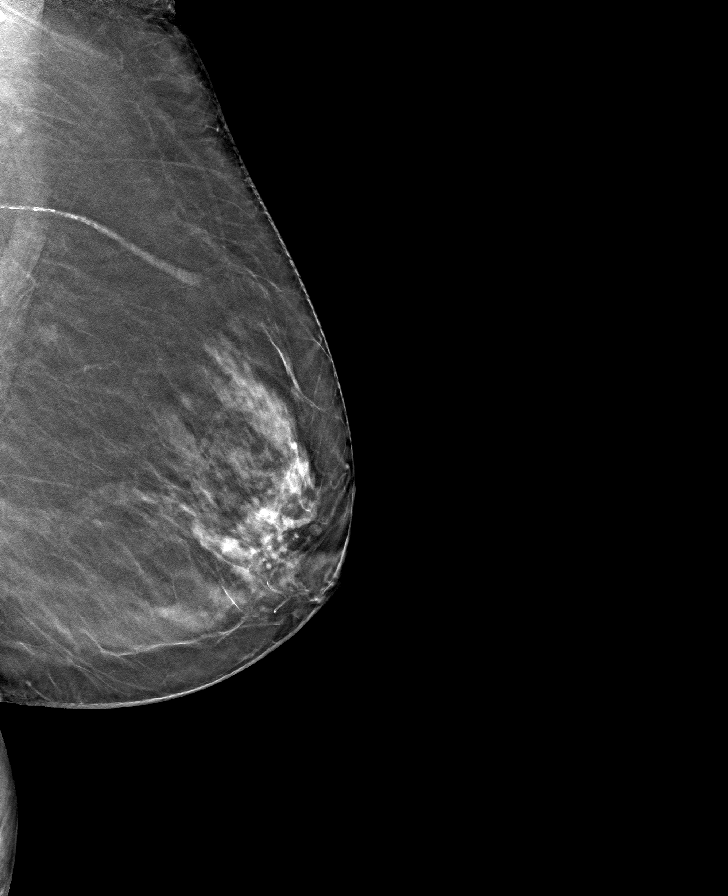

[R CC tomo · tomo slice 25/50.0]
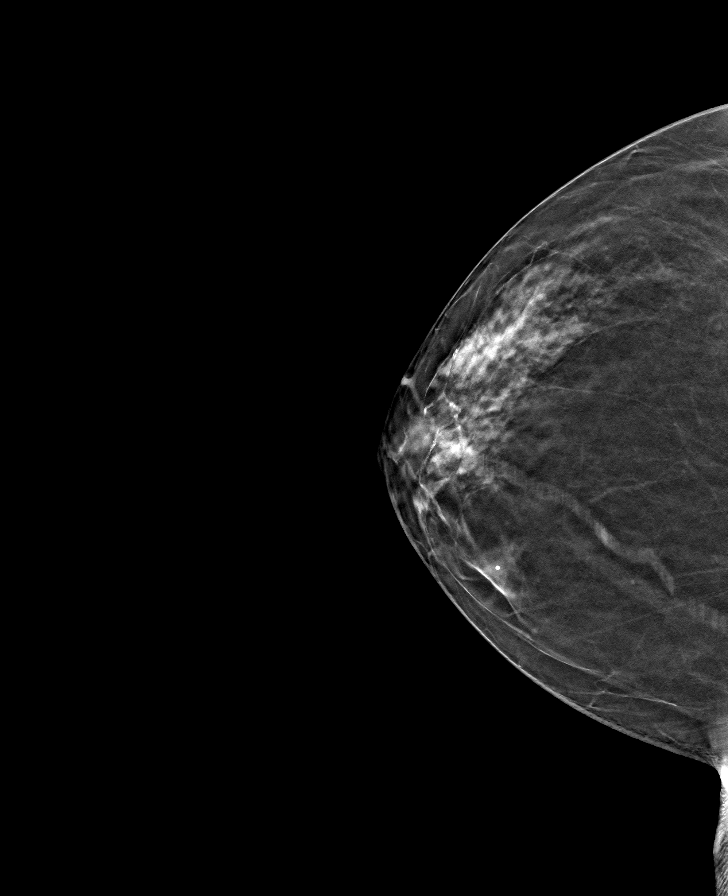

[8 of 24 positions shown; findings below may reference images not displayed]

ACR Breast Density Category c: The breast tissue is heterogeneously
dense, which may obscure small masses.
FINDINGS: No suspicious calcifications, masses or areas of distortion are seen
in the bilateral breasts. No masses are identified in the left
breast.

Mammographic images were processed with CAD.
IMPRESSION: 1.  No masses are identified in the left breast.

2. No suspicious calcifications, masses or areas of distortion are
seen in the bilateral breasts.

RECOMMENDATION:
Screening mammogram in one year.(Code:[id number]-9LP)

I have discussed the findings and recommendations with the patient.
If applicable, a reminder letter will be sent to the patient
regarding the next appointment.

BI-RADS CATEGORY  1: Negative.

## 2022-04-25 ENCOUNTER — Other Ambulatory Visit: Payer: Self-pay | Admitting: Family Medicine

## 2022-04-25 DIAGNOSIS — M8580 Other specified disorders of bone density and structure, unspecified site: Secondary | ICD-10-CM

## 2022-06-17 ENCOUNTER — Ambulatory Visit
Admission: RE | Admit: 2022-06-17 | Discharge: 2022-06-17 | Disposition: A | Discharge status: Home / Self Care | Payer: Medicare Other | Source: Ambulatory Visit | Attending: Family Medicine | Admitting: Family Medicine

## 2022-06-17 DIAGNOSIS — M8589 Other specified disorders of bone density and structure, multiple sites: Secondary | ICD-10-CM | POA: Insufficient documentation

## 2022-06-17 DIAGNOSIS — Z13820 Encounter for screening for osteoporosis: Secondary | ICD-10-CM | POA: Diagnosis present

## 2022-06-17 DIAGNOSIS — Z780 Asymptomatic menopausal state: Secondary | ICD-10-CM | POA: Insufficient documentation

## 2022-06-17 DIAGNOSIS — M8580 Other specified disorders of bone density and structure, unspecified site: Secondary | ICD-10-CM | POA: Insufficient documentation

## 2022-10-10 DIAGNOSIS — M7540 Impingement syndrome of unspecified shoulder: Secondary | ICD-10-CM | POA: Insufficient documentation

## 2022-10-21 ENCOUNTER — Ambulatory Visit
Admission: RE | Admit: 2022-10-21 | Discharge: 2022-10-21 | Disposition: A | Discharge status: Home / Self Care | Payer: Medicare Other | Source: Ambulatory Visit | Attending: Family Medicine | Admitting: Family Medicine

## 2022-10-21 ENCOUNTER — Other Ambulatory Visit: Payer: Self-pay | Admitting: Family Medicine

## 2022-10-21 ENCOUNTER — Ambulatory Visit
Admission: RE | Admit: 2022-10-21 | Discharge: 2022-10-21 | Disposition: A | Discharge status: Home / Self Care | Payer: Medicare Other | Attending: Family Medicine | Admitting: Family Medicine

## 2022-10-21 DIAGNOSIS — M19041 Primary osteoarthritis, right hand: Secondary | ICD-10-CM | POA: Diagnosis present

## 2022-10-21 DIAGNOSIS — M7541 Impingement syndrome of right shoulder: Secondary | ICD-10-CM | POA: Insufficient documentation

## 2022-10-21 DIAGNOSIS — M1711 Unilateral primary osteoarthritis, right knee: Secondary | ICD-10-CM | POA: Insufficient documentation

## 2023-04-22 ENCOUNTER — Encounter: Payer: Self-pay | Admitting: Ophthalmology

## 2023-04-22 NOTE — Anesthesia Preprocedure Evaluation (Addendum)
Anesthesia Evaluation  Patient identified by MRN, date of birth, ID band Patient awake    Reviewed: Allergy & Precautions, H&P , NPO status , Patient's Chart, lab work & pertinent test results  Airway Mallampati: IV  TM Distance: <3 FB Neck ROM: Full  Mouth opening: Limited Mouth Opening  Dental  (+) Edentulous Upper Has some lower teeth, poor dentition, likely slightly loose, Would likely be more anterior airway if intubation were needed :   Pulmonary neg pulmonary ROS   Pulmonary exam normal breath sounds clear to auscultation       Cardiovascular hypertension, negative cardio ROS Normal cardiovascular exam Rhythm:Regular Rate:Normal     Neuro/Psych negative neurological ROS  negative psych ROS   GI/Hepatic negative GI ROS, Neg liver ROS,,,  Endo/Other  negative endocrine ROS    Renal/GU negative Renal ROS  negative genitourinary   Musculoskeletal negative musculoskeletal ROS (+) Arthritis ,    Abdominal   Peds negative pediatric ROS (+)  Hematology negative hematology ROS (+)   Anesthesia Other Findings Osteoporosis  Elevated lipids Hypertension  Wears dentures Arthritis  Engineer, structural used family member per patient request    Reproductive/Obstetrics negative OB ROS                             Anesthesia Physical Anesthesia Plan  ASA: 2  Anesthesia Plan: MAC   Post-op Pain Management:    Induction: Intravenous  PONV Risk Score and Plan:   Airway Management Planned: Natural Airway and Nasal Cannula  Additional Equipment:   Intra-op Plan:   Post-operative Plan:   Informed Consent: I have reviewed the patients History and Physical, chart, labs and discussed the procedure including the risks, benefits and alternatives for the proposed anesthesia with the patient or authorized representative who has indicated his/her understanding and acceptance.     Dental  Advisory Given  Plan Discussed with: Anesthesiologist, CRNA and Surgeon  Anesthesia Plan Comments: (Patient consented for risks of anesthesia including but not limited to:  - adverse reactions to medications - damage to eyes, teeth, lips or other oral mucosa - nerve damage due to positioning  - sore throat or hoarseness - Damage to heart, brain, nerves, lungs, other parts of body or loss of life  Patient voiced understanding.)       Anesthesia Quick Evaluation

## 2023-04-23 NOTE — Discharge Instructions (Signed)
El cuidado despu?s de una operaci?n de cataratas ?Cataract Surgery, Care After (Spanish) ? ?Esta hoja le da informaci?n sobre c?mo cuidarse despu?s de su cirug?a. Su oftalm?logo puede darle instrucciones m?s espec?ficas tambi?n. Si tiene problemas o preguntas, comun?quese con su doctor en el [facility], [phone]. ? ??Qu? puedo esperar despu?s de la cirug?a? ?Es normal tener: ?Picaz?n ?Sensaci?n de tener un cuerpo extra?o (se siente como un grano de arena en el ojo) ?Secreci?n acuosa (lagrimeo excesivo) ?Sensibilidad a la luz y al tacto ?Moretones en el ojo o a su alrededor ?Visi?n ligeramente borrosa ? ?Siga estas instrucciones en casa: ?No se toque ni se frote los ojos. ?Es posible que le digan que use una pantalla protectora o lentes de sol para proteger sus ojos. ?No se ponga un lente de contacto en el ojo operado hasta que su doctor lo apruebe. ?Mantenga los p?rpados y la cara limpios y secos. ?No deje que el agua le caiga directamente en la cara mientras se duche. ?Evite el jab?n y champ? en los ojos. ?No se maquille los ojos por una semana. ? ?Rev?sese su ojo todos los d?as para ver si hay  signos de infecci?n. Est? atento(a): ?Enrojecimiento, hinchaz?n o dolor. ?L?quido, sangre o pus. ?Empeoramiento de la visi?n. ?Aumento de la sensibilidad a la luz o al tacto. ? ?Actividad: ?Durante el primer d?a, evite agacharse y leer. Puede volver a leer y a agacharse al d?a siguiente. ?No maneje ni use maquinaria pesada durante al menos 24 horas. ?Evite las actividades vigorosas durante una semana. Est? bien realizar actividades como caminar, usar la cinta de correr, usar la bicicleta est?tica y subir las escaleras. ?No levante objetos pesados (m?s de 20 libras) por una semana. ?No realice trabajos de jardiner?a ni tareas dom?sticas sucias en la casa (como limpiar los pisos, los ba?os, pasar la aspiradora, etc.) durante una semana. ?No nade ni use un jacuzzi durante 2 semanas.  ?Pregunte a su doctor cu?ndo  usted puede volver a trabajar. ? ?Instrucciones generales: ?Tome o aplique los medicamentos recetados y de venta libre seg?n le indique su doctor, incluyendo las gotas para los ojos y las pomadas. ?Contin?e tomando los medicamentos que fueron suspendidos antes de la cirug?a, a menos que su doctor le indique lo contrario. ?Vaya a todas sus citas de seguimiento que ya fueron programadas. ? ? ?P?ngase en contacto con un proveedor de atenci?n m?dica si: ?Le aparecen m?s moretones alrededor del ojo. ?Tiene dolor que no se alivia con el medicamento. ?Tiene fiebre. ?Le sale l?quido, pus o sangre del ojo o de la incisi?n. ?Su sensibilidad a la luz empeora. ?Tiene manchas (moscas volantes) o destellos de luz en la vista. ?Tiene n?useas o v?mitos. ? ?Vaya al Departamento de Emergencias m?s cercana o llame al 911 si: ?Pierde la vista de forma repentina. ?Tiene un dolor de ojo que es intenso y que se est? empeorando. ?

## 2023-04-29 ENCOUNTER — Ambulatory Visit
Admission: RE | Admit: 2023-04-29 | Discharge: 2023-04-29 | Disposition: A | Discharge status: Home / Self Care | Payer: Medicare Other | Attending: Ophthalmology | Admitting: Ophthalmology

## 2023-04-29 ENCOUNTER — Encounter: Payer: Self-pay | Admitting: Ophthalmology

## 2023-04-29 ENCOUNTER — Ambulatory Visit: Payer: Medicare Other | Admitting: Anesthesiology

## 2023-04-29 ENCOUNTER — Other Ambulatory Visit: Payer: Self-pay

## 2023-04-29 ENCOUNTER — Encounter
Admission: RE | Disposition: A | Discharge status: Home / Self Care | Payer: Self-pay | Source: Home / Self Care | Attending: Ophthalmology

## 2023-04-29 DIAGNOSIS — M810 Age-related osteoporosis without current pathological fracture: Secondary | ICD-10-CM | POA: Insufficient documentation

## 2023-04-29 DIAGNOSIS — H2511 Age-related nuclear cataract, right eye: Secondary | ICD-10-CM | POA: Diagnosis present

## 2023-04-29 HISTORY — DX: Unspecified osteoarthritis, unspecified site: M19.90

## 2023-04-29 HISTORY — DX: Presence of dental prosthetic device (complete) (partial): Z97.2

## 2023-04-29 HISTORY — PX: CATARACT EXTRACTION W/PHACO: SHX586

## 2023-04-29 SURGERY — PHACOEMULSIFICATION, CATARACT, WITH IOL INSERTION
Anesthesia: Monitor Anesthesia Care | Laterality: Right

## 2023-04-29 MED ORDER — MIDAZOLAM HCL 2 MG/2ML IJ SOLN
INTRAMUSCULAR | Status: DC | PRN
  Administered 2023-04-29: 1 mg via INTRAVENOUS

## 2023-04-29 MED ORDER — TETRACAINE HCL 0.5 % OP SOLN
1.0000 [drp] | OPHTHALMIC | Status: DC | PRN
  Administered 2023-04-29 (×3): 1 [drp] via OPHTHALMIC

## 2023-04-29 MED ORDER — LACTATED RINGERS IV SOLN: INTRAVENOUS | Status: DC

## 2023-04-29 MED ORDER — CEFUROXIME OPHTHALMIC INJECTION 1 MG/0.1 ML
INJECTION | OPHTHALMIC | Status: DC | PRN
  Administered 2023-04-29: 1 mg via INTRACAMERAL

## 2023-04-29 MED ORDER — SIGHTPATH DOSE#1 BSS IO SOLN
INTRAOCULAR | Status: DC | PRN
  Administered 2023-04-29: 55 mL via OPHTHALMIC

## 2023-04-29 MED ORDER — SIGHTPATH DOSE#1 BSS IO SOLN
INTRAOCULAR | Status: DC | PRN
  Administered 2023-04-29: 2 mL

## 2023-04-29 MED ORDER — FENTANYL CITRATE (PF) 100 MCG/2ML IJ SOLN
INTRAMUSCULAR | Status: DC | PRN
  Administered 2023-04-29: 50 ug via INTRAVENOUS

## 2023-04-29 MED ORDER — SODIUM CHLORIDE 0.9% FLUSH
INTRAVENOUS | Status: DC | PRN
  Administered 2023-04-29: 10 mL via INTRAVENOUS

## 2023-04-29 MED ORDER — BRIMONIDINE TARTRATE-TIMOLOL 0.2-0.5 % OP SOLN
OPHTHALMIC | Status: DC | PRN
  Administered 2023-04-29: 1 [drp] via OPHTHALMIC

## 2023-04-29 MED ORDER — SIGHTPATH DOSE#1 BSS IO SOLN
INTRAOCULAR | Status: DC | PRN
  Administered 2023-04-29: 15 mL via INTRAOCULAR

## 2023-04-29 MED ORDER — SIGHTPATH DOSE#1 NA HYALUR & NA CHOND-NA HYALUR IO KIT
PACK | INTRAOCULAR | Status: DC | PRN
  Administered 2023-04-29: 1 via OPHTHALMIC

## 2023-04-29 MED ORDER — ARMC OPHTHALMIC DILATING DROPS
1.0000 | OPHTHALMIC | Status: DC | PRN
  Administered 2023-04-29 (×3): 1 via OPHTHALMIC

## 2023-04-29 SURGICAL SUPPLY — 9 items
CATARACT SUITE SIGHTPATH (MISCELLANEOUS) ×1 IMPLANT
FEE CATARACT SUITE SIGHTPATH (MISCELLANEOUS) ×1 IMPLANT
GLOVE SRG 8 PF TXTR STRL LF DI (GLOVE) ×1 IMPLANT
GLOVE SURG ENC TEXT LTX SZ7.5 (GLOVE) ×1 IMPLANT
GLOVE SURG UNDER POLY LF SZ8 (GLOVE) ×1
LENS IOL TECNIS EYHANCE 21.5 (Intraocular Lens) IMPLANT
NDL FILTER BLUNT 18X1 1/2 (NEEDLE) ×1 IMPLANT
NEEDLE FILTER BLUNT 18X1 1/2 (NEEDLE) ×1 IMPLANT
SYR 3ML LL SCALE MARK (SYRINGE) ×1 IMPLANT

## 2023-04-29 NOTE — H&P (Signed)
Jacqueline Matthews   Primary Care Physician:  Ethelda Chick, MD Ophthalmologist: Dr. Lockie Mola  Pre-Procedure History & Physical: HPI:  Jacqueline Matthews is a 82 y.o. female here for ophthalmic surgery.   Past Medical History:  Diagnosis Date   Arthritis    Elevated lipids    Hypertension    no longer elevated MD discontinued med   Osteoporosis    Wears dentures    partial upper and lower    Past Surgical History:  Procedure Laterality Date   CHOLECYSTECTOMY     COLONOSCOPY  2000   [place]   INGUINAL HERNIA REPAIR Right 12/30/2016   Procedure: HERNIA REPAIR INGUINAL ADULT;  Surgeon: Nadeen Landau, MD;  Location: [facility] ORS;  Service: General;  Laterality: Right;   VARICOSE VEIN SURGERY      Prior to Admission medications   Medication Sig Start Date End Date Taking? Authorizing Provider  acetaminophen (TYLENOL) 500 MG tablet Take 500-1,000 mg by mouth every 6 (six) hours as needed (for pain.).    Yes [provider]  alendronate (FOSAMAX) 70 MG tablet Take 70 mg by mouth once a week. Take with a full glass of water on an empty stomach.   Yes [provider]  COLLAGEN PO Take 30 mg by mouth daily.   Yes [provider]  diclofenac Sodium (VOLTAREN) 1 % GEL Apply topically 4 (four) times daily.   Yes [provider]  ERGOCALCIFEROL PO Take 1,250 mg by mouth daily.   Yes [provider]  fluticasone (FLONASE) 50 MCG/ACT nasal spray Place into both nostrils daily.   Yes [provider]  Magnesium 300 MG CAPS Take by mouth daily.   Yes [provider]  pravastatin (PRAVACHOL) 40 MG tablet Take 80 mg by mouth at bedtime.   Yes [provider]    Allergies as of 03/18/2023   (No Known Allergies)    Family History  Problem Relation Age of Onset   Breast cancer Neg Hx     Social History   Socioeconomic History   Marital status: Married    Spouse name: Not on file   Number of children: Not  on file   Years of education: Not on file   Highest education level: Not on file  Occupational History   Not on file  Tobacco Use   Smoking status: Never   Smokeless tobacco: Never  Vaping Use   Vaping Use: Never used  Substance and Sexual Activity   Alcohol use: Not Currently    Comment: rare   Drug use: No   Sexual activity: Not on file  Other Topics Concern   Not on file  Social History Narrative   Not on file   Social Determinants of Health   Financial Resource Strain: Not on file  Food Insecurity: Not on file  Transportation Needs: Not on file  Physical Activity: Not on file  Stress: Not on file  Social Connections: Not on file  Intimate Partner Violence: Not on file    Review of Systems: See HPI, otherwise negative ROS  Physical Exam: BP (!) 127/58   Temp 97.9 F (36.6 C) (Temporal)   Resp 17   Ht 4' 11.02" (1.499 m)   Wt 53.8 kg   SpO2 96%   BMI 23.92 kg/m  General:   Alert,  pleasant and cooperative in NAD Head:  Normocephalic and atraumatic. Lungs:  Clear to auscultation.    Heart:  Regular rate and rhythm.   Impression/Plan:  Jacqueline Matthews is here for ophthalmic surgery.  Risks, benefits, limitations, and alternatives regarding ophthalmic surgery have been reviewed with the patient.  Questions have been answered.  All parties agreeable.   Lockie Mola, MD  04/29/2023, 11:07 AM

## 2023-04-29 NOTE — Transfer of Care (Signed)
Immediate Anesthesia Transfer of Care Note  Patient: Jacqueline Matthews  Procedure(s) Performed: CATARACT EXTRACTION PHACO AND INTRAOCULAR LENS PLACEMENT (IOC) RIGHT 6.40 00:436. (Right)  Patient Location: PACU  Anesthesia Type:MAC  Level of Consciousness: awake, alert , and oriented  Airway & Oxygen Therapy: Patient Spontanous Breathing  Post-op Assessment: Report given to RN  Post vital signs: Reviewed and stable  Last Vitals: See flow sheet for PACU Vitals Value Taken Time  BP 113/58 04/29/23 1215  Temp    Pulse 72 04/29/23 1217  Resp 14 04/29/23 1217  SpO2 97 % 04/29/23 1217  Vitals shown include unvalidated device data.  Last Pain:  Vitals:   04/29/23 1059  TempSrc: Temporal  PainSc: 0-No pain         Complications: No notable events documented.

## 2023-04-29 NOTE — Anesthesia Postprocedure Evaluation (Signed)
Anesthesia Post Note  Patient: Jacqueline Matthews  Procedure(s) Performed: CATARACT EXTRACTION PHACO AND INTRAOCULAR LENS PLACEMENT (IOC) RIGHT 6.40 00:436. (Right)  Patient location during evaluation: PACU Anesthesia Type: MAC Level of consciousness: awake and alert Pain management: pain level controlled Vital Signs Assessment: post-procedure vital signs reviewed and stable Respiratory status: spontaneous breathing, nonlabored ventilation, respiratory function stable and patient connected to nasal cannula oxygen Cardiovascular status: stable and blood pressure returned to baseline Postop Assessment: no apparent nausea or vomiting Anesthetic complications: no   No notable events documented.   Last Vitals:  Vitals:   04/29/23 1215 04/29/23 1220  BP: (!) 113/58 (!) 115/57  Pulse: 69 71  Resp: 17 16  Temp: (!) 36.3 C (!) 36.3 C  SpO2: 97% 96%    Last Pain:  Vitals:   04/29/23 1220  TempSrc:   PainSc: 0-No pain                 Priyansh Pry C Mohammed Mcandrew

## 2023-04-29 NOTE — Op Note (Signed)
LOCATION:  [facility]   PREOPERATIVE DIAGNOSIS:    Nuclear sclerotic cataract right eye. H25.11   POSTOPERATIVE DIAGNOSIS:  Nuclear sclerotic cataract right eye.     PROCEDURE:  Phacoemusification with posterior chamber intraocular lens placement of the right eye   ULTRASOUND TIME: Procedure(s): CATARACT EXTRACTION PHACO AND INTRAOCULAR LENS PLACEMENT (IOC) RIGHT 6.40 00:436. (Right)  LENS:   Implant Name Type Inv. Item Serial No. Manufacturer Lot No. LRB No. Used Action  LENS IOL TECNIS EYHANCE 21.5 - [id number] Intraocular Lens LENS IOL TECNIS EYHANCE 21.5 [id number] [employer]  Right 1 Implanted         SURGEON:  Deirdre Evener, MD   ANESTHESIA:  Topical with tetracaine drops and 2% Xylocaine jelly, augmented with 1% preservative-free intracameral lidocaine.    COMPLICATIONS:  None.   DESCRIPTION OF PROCEDURE:  The patient was identified in the holding room and transported to the operating room and placed in the supine position under the operating microscope.  The right eye was identified as the operative eye and it was prepped and draped in the usual sterile ophthalmic fashion.   A 1 millimeter clear-corneal paracentesis was made at the 12:00 position.  0.5 ml of preservative-free 1% lidocaine was injected into the anterior chamber. The anterior chamber was filled with Viscoat viscoelastic.  A 2.4 millimeter keratome was used to make a near-clear corneal incision at the 9:00 position.  A curvilinear capsulorrhexis was made with a cystotome and capsulorrhexis forceps.  Balanced salt solution was used to hydrodissect and hydrodelineate the nucleus.   Phacoemulsification was then used in stop and chop fashion to remove the lens nucleus and epinucleus.  The remaining cortex was then removed using the irrigation and aspiration handpiece. Provisc was then placed into the capsular bag to distend it for lens placement.  A lens was then injected into the capsular bag.  The  remaining viscoelastic was aspirated.   Wounds were hydrated with balanced salt solution.  The anterior chamber was inflated to a physiologic pressure with balanced salt solution.  No wound leaks were noted. Cefuroxime 0.1 ml of a 10mg /ml solution was injected into the anterior chamber for a dose of 1 mg of intracameral antibiotic at the completion of the case.   Timolol and Brimonidine drops were applied to the eye.  The patient was taken to the recovery room in stable condition without complications of anesthesia or surgery.   Nekhi Liwanag 04/29/2023, 12:15 PM

## 2023-04-30 ENCOUNTER — Encounter: Payer: Self-pay | Admitting: Ophthalmology

## 2023-05-05 ENCOUNTER — Encounter: Payer: Self-pay | Admitting: Ophthalmology

## 2023-05-05 NOTE — Anesthesia Preprocedure Evaluation (Addendum)
Anesthesia Evaluation  Patient identified by MRN, date of birth, ID band Patient awake    Reviewed: Allergy & Precautions, H&P , NPO status , Patient's Chart, lab work & pertinent test results  Airway Mallampati: IV  TM Distance: <3 FB    Comment: (+) Edentulous Upper Has some lower teeth, poor dentition, likely slightly loose, Would likely be more anterior airway if intubation were needed :   Dental  (+) Poor Dentition, Edentulous Upper (+) Edentulous Upper Has some lower teeth, poor dentition, likely slightly loose, Would likely be more anterior airway if intubation were needed :  :   Pulmonary neg pulmonary ROS          Cardiovascular hypertension, negative cardio ROS      Neuro/Psych negative neurological ROS  negative psych ROS   GI/Hepatic negative GI ROS, Neg liver ROS,,,  Endo/Other  negative endocrine ROS    Renal/GU negative Renal ROS  negative genitourinary   Musculoskeletal negative musculoskeletal ROS (+) Arthritis ,    Abdominal   Peds negative pediatric ROS (+)  Hematology negative hematology ROS (+)   Anesthesia Other Findings Osteoporosis  Elevated lipids Hypertension  Wears dentures Arthritis     Reproductive/Obstetrics negative OB ROS                              Anesthesia Physical Anesthesia Plan  ASA: 2  Anesthesia Plan: MAC   Post-op Pain Management:    Induction: Intravenous  PONV Risk Score and Plan:   Airway Management Planned: Natural Airway and Nasal Cannula  Additional Equipment:   Intra-op Plan:   Post-operative Plan:   Informed Consent: I have reviewed the patients History and Physical, chart, labs and discussed the procedure including the risks, benefits and alternatives for the proposed anesthesia with the patient or authorized representative who has indicated his/her understanding and acceptance.     Dental Advisory  Given  Plan Discussed with: Anesthesiologist, CRNA and Surgeon  Anesthesia Plan Comments: (Patient consented for risks of anesthesia including but not limited to:  - adverse reactions to medications - damage to eyes, teeth, lips or other oral mucosa - nerve damage due to positioning  - sore throat or hoarseness - Damage to heart, brain, nerves, lungs, other parts of body or loss of life  Patient voiced understanding.)         Anesthesia Quick Evaluation

## 2023-05-11 NOTE — Discharge Instructions (Signed)
El cuidado despu?s de una operaci?n de cataratas ?Cataract Surgery, Care After (Spanish) ? ?Esta hoja le da informaci?n sobre c?mo cuidarse despu?s de su cirug?a. Su oftalm?logo puede darle instrucciones m?s espec?ficas tambi?n. Si tiene problemas o preguntas, comun?quese con su doctor en el [facility], [phone]. ? ??Qu? puedo esperar despu?s de la cirug?a? ?Es normal tener: ?Picaz?n ?Sensaci?n de tener un cuerpo extra?o (se siente como un grano de arena en el ojo) ?Secreci?n acuosa (lagrimeo excesivo) ?Sensibilidad a la luz y al tacto ?Moretones en el ojo o a su alrededor ?Visi?n ligeramente borrosa ? ?Siga estas instrucciones en casa: ?No se toque ni se frote los ojos. ?Es posible que le digan que use una pantalla protectora o lentes de sol para proteger sus ojos. ?No se ponga un lente de contacto en el ojo operado hasta que su doctor lo apruebe. ?Mantenga los p?rpados y la cara limpios y secos. ?No deje que el agua le caiga directamente en la cara mientras se duche. ?Evite el jab?n y champ? en los ojos. ?No se maquille los ojos por una semana. ? ?Rev?sese su ojo todos los d?as para ver si hay  signos de infecci?n. Est? atento(a): ?Enrojecimiento, hinchaz?n o dolor. ?L?quido, sangre o pus. ?Empeoramiento de la visi?n. ?Aumento de la sensibilidad a la luz o al tacto. ? ?Actividad: ?Durante el primer d?a, evite agacharse y leer. Puede volver a leer y a agacharse al d?a siguiente. ?No maneje ni use maquinaria pesada durante al menos 24 horas. ?Evite las actividades vigorosas durante una semana. Est? bien realizar actividades como caminar, usar la cinta de correr, usar la bicicleta est?tica y subir las escaleras. ?No levante objetos pesados (m?s de 20 libras) por una semana. ?No realice trabajos de jardiner?a ni tareas dom?sticas sucias en la casa (como limpiar los pisos, los ba?os, pasar la aspiradora, etc.) durante una semana. ?No nade ni use un jacuzzi durante 2 semanas.  ?Pregunte a su doctor cu?ndo  usted puede volver a trabajar. ? ?Instrucciones generales: ?Tome o aplique los medicamentos recetados y de venta libre seg?n le indique su doctor, incluyendo las gotas para los ojos y las pomadas. ?Contin?e tomando los medicamentos que fueron suspendidos antes de la cirug?a, a menos que su doctor le indique lo contrario. ?Vaya a todas sus citas de seguimiento que ya fueron programadas. ? ? ?P?ngase en contacto con un proveedor de atenci?n m?dica si: ?Le aparecen m?s moretones alrededor del ojo. ?Tiene dolor que no se alivia con el medicamento. ?Tiene fiebre. ?Le sale l?quido, pus o sangre del ojo o de la incisi?n. ?Su sensibilidad a la luz empeora. ?Tiene manchas (moscas volantes) o destellos de luz en la vista. ?Tiene n?useas o v?mitos. ? ?Vaya al Departamento de Emergencias m?s cercana o llame al 911 si: ?Pierde la vista de forma repentina. ?Tiene un dolor de ojo que es intenso y que se est? empeorando. ?

## 2023-05-13 ENCOUNTER — Encounter
Admission: RE | Disposition: A | Discharge status: Home / Self Care | Payer: Self-pay | Source: Home / Self Care | Attending: Ophthalmology

## 2023-05-13 ENCOUNTER — Other Ambulatory Visit: Payer: Self-pay

## 2023-05-13 ENCOUNTER — Ambulatory Visit: Payer: Medicare Other | Admitting: Anesthesiology

## 2023-05-13 ENCOUNTER — Ambulatory Visit
Admission: RE | Admit: 2023-05-13 | Discharge: 2023-05-13 | Disposition: A | Discharge status: Home / Self Care | Payer: Medicare Other | Attending: Ophthalmology | Admitting: Ophthalmology

## 2023-05-13 ENCOUNTER — Encounter: Payer: Self-pay | Admitting: Ophthalmology

## 2023-05-13 DIAGNOSIS — H2512 Age-related nuclear cataract, left eye: Secondary | ICD-10-CM | POA: Diagnosis present

## 2023-05-13 DIAGNOSIS — Z79899 Other long term (current) drug therapy: Secondary | ICD-10-CM | POA: Diagnosis not present

## 2023-05-13 DIAGNOSIS — M810 Age-related osteoporosis without current pathological fracture: Secondary | ICD-10-CM | POA: Insufficient documentation

## 2023-05-13 DIAGNOSIS — I10 Essential (primary) hypertension: Secondary | ICD-10-CM | POA: Insufficient documentation

## 2023-05-13 HISTORY — PX: CATARACT EXTRACTION W/PHACO: SHX586

## 2023-05-13 SURGERY — PHACOEMULSIFICATION, CATARACT, WITH IOL INSERTION
Anesthesia: Monitor Anesthesia Care | Laterality: Left

## 2023-05-13 MED ORDER — FENTANYL CITRATE (PF) 100 MCG/2ML IJ SOLN
INTRAMUSCULAR | Status: DC | PRN
  Administered 2023-05-13: 50 ug via INTRAVENOUS

## 2023-05-13 MED ORDER — SIGHTPATH DOSE#1 NA HYALUR & NA CHOND-NA HYALUR IO KIT
PACK | INTRAOCULAR | Status: DC | PRN
  Administered 2023-05-13: 1 via OPHTHALMIC

## 2023-05-13 MED ORDER — TETRACAINE HCL 0.5 % OP SOLN
1.0000 [drp] | OPHTHALMIC | Status: DC | PRN
  Administered 2023-05-13 (×3): 1 [drp] via OPHTHALMIC

## 2023-05-13 MED ORDER — ACETAMINOPHEN 500 MG PO TABS
1000.0000 mg | ORAL_TABLET | Freq: Four times a day (QID) | ORAL | Status: DC | PRN
  Administered 2023-05-13: 1000 mg via ORAL

## 2023-05-13 MED ORDER — SIGHTPATH DOSE#1 BSS IO SOLN
INTRAOCULAR | Status: DC | PRN
  Administered 2023-05-13: 2 mL

## 2023-05-13 MED ORDER — SIGHTPATH DOSE#1 BSS IO SOLN
INTRAOCULAR | Status: DC | PRN
  Administered 2023-05-13: 59 mL via OPHTHALMIC

## 2023-05-13 MED ORDER — CEFUROXIME OPHTHALMIC INJECTION 1 MG/0.1 ML
INJECTION | OPHTHALMIC | Status: DC | PRN
  Administered 2023-05-13: 1 mg via INTRACAMERAL

## 2023-05-13 MED ORDER — LACTATED RINGERS IV SOLN: INTRAVENOUS | Status: DC

## 2023-05-13 MED ORDER — ARMC OPHTHALMIC DILATING DROPS
1.0000 | OPHTHALMIC | Status: DC | PRN
  Administered 2023-05-13 (×3): 1 via OPHTHALMIC

## 2023-05-13 MED ORDER — MIDAZOLAM HCL 2 MG/2ML IJ SOLN
INTRAMUSCULAR | Status: DC | PRN
  Administered 2023-05-13: 1 mg via INTRAVENOUS

## 2023-05-13 MED ORDER — BRIMONIDINE TARTRATE-TIMOLOL 0.2-0.5 % OP SOLN
OPHTHALMIC | Status: DC | PRN
  Administered 2023-05-13: 1 [drp] via OPHTHALMIC

## 2023-05-13 MED ORDER — SIGHTPATH DOSE#1 BSS IO SOLN
INTRAOCULAR | Status: DC | PRN
  Administered 2023-05-13: 15 mL via INTRAOCULAR

## 2023-05-13 SURGICAL SUPPLY — 9 items
CATARACT SUITE SIGHTPATH (MISCELLANEOUS) ×1 IMPLANT
FEE CATARACT SUITE SIGHTPATH (MISCELLANEOUS) ×1 IMPLANT
GLOVE SRG 8 PF TXTR STRL LF DI (GLOVE) ×1 IMPLANT
GLOVE SURG ENC TEXT LTX SZ7.5 (GLOVE) ×1 IMPLANT
GLOVE SURG UNDER POLY LF SZ8 (GLOVE) ×1
LENS IOL TECNIS EYHANCE 22.0 (Intraocular Lens) IMPLANT
NDL FILTER BLUNT 18X1 1/2 (NEEDLE) ×1 IMPLANT
NEEDLE FILTER BLUNT 18X1 1/2 (NEEDLE) ×1 IMPLANT
SYR 3ML LL SCALE MARK (SYRINGE) ×1 IMPLANT

## 2023-05-13 NOTE — Anesthesia Postprocedure Evaluation (Signed)
Anesthesia Post Note  Patient: Jacqueline Matthews  Procedure(s) Performed: CATARACT EXTRACTION PHACO AND INTRAOCULAR LENS PLACEMENT (IOC) LEFT 6.64 00:33.4 (Left)  Patient location during evaluation: PACU Anesthesia Type: MAC Level of consciousness: awake and alert Pain management: pain level controlled Vital Signs Assessment: post-procedure vital signs reviewed and stable Respiratory status: spontaneous breathing, nonlabored ventilation, respiratory function stable and patient connected to nasal cannula oxygen Cardiovascular status: stable and blood pressure returned to baseline Postop Assessment: no apparent nausea or vomiting Anesthetic complications: no   No notable events documented.   Last Vitals:  Vitals:   05/13/23 0915 05/13/23 0922  BP: (!) 131/54 114/61  Pulse: 71 79  Resp: 13 17  Temp: (!) 36.1 C (!) 36.1 C  SpO2: 96% 95%    Last Pain:  Vitals:   05/13/23 0922  TempSrc:   PainSc: 3                  Saxton Chain C Phu Record

## 2023-05-13 NOTE — H&P (Signed)
[facility]   Primary Care Physician:  Ethelda Chick, MD Ophthalmologist: Dr. Lockie Mola  Pre-Procedure History & Physical: HPI:  Jacqueline Matthews is a 82 y.o. female here for ophthalmic surgery.   Past Medical History:  Diagnosis Date   Arthritis    Elevated lipids    Hypertension    no longer elevated MD discontinued med   Osteoporosis    Wears dentures    partial upper and lower    Past Surgical History:  Procedure Laterality Date   CATARACT EXTRACTION W/PHACO Right 04/29/2023   Procedure: CATARACT EXTRACTION PHACO AND INTRAOCULAR LENS PLACEMENT (IOC) RIGHT 6.40 00:436.;  Surgeon: Lockie Mola, MD;  Location: [facility];  Service: Ophthalmology;  Laterality: Right;   CHOLECYSTECTOMY     COLONOSCOPY  2000   [place]   INGUINAL HERNIA REPAIR Right 12/30/2016   Procedure: HERNIA REPAIR INGUINAL ADULT;  Surgeon: Nadeen Landau, MD;  Location: [facility];  Service: General;  Laterality: Right;   VARICOSE VEIN SURGERY      Prior to Admission medications   Medication Sig Start Date End Date Taking? Authorizing Provider  acetaminophen (TYLENOL) 500 MG tablet Take 500-1,000 mg by mouth every 6 (six) hours as needed (for pain.).    Yes [provider]  alendronate (FOSAMAX) 70 MG tablet Take 70 mg by mouth once a week. Take with a full glass of water on an empty stomach.   Yes [provider]  COLLAGEN PO Take 30 mg by mouth daily.   Yes [provider]  diclofenac Sodium (VOLTAREN) 1 % GEL Apply topically 4 (four) times daily.   Yes [provider]  ERGOCALCIFEROL PO Take 1,250 mg by mouth daily.   Yes [provider]  fluticasone (FLONASE) 50 MCG/ACT nasal spray Place into both nostrils daily.   Yes [provider]  pravastatin (PRAVACHOL) 40 MG tablet Take 80 mg by mouth at bedtime.   Yes [provider]  Magnesium 300 MG CAPS Take by mouth daily. Patient not taking: Reported on  05/05/2023    [provider]    Allergies as of 03/18/2023   (No Known Allergies)    Family History  Problem Relation Age of Onset   Breast cancer Neg Hx     Social History   Socioeconomic History   Marital status: Married    Spouse name: Not on file   Number of children: Not on file   Years of education: Not on file   Highest education level: Not on file  Occupational History   Not on file  Tobacco Use   Smoking status: Never   Smokeless tobacco: Never  Vaping Use   Vaping Use: Never used  Substance and Sexual Activity   Alcohol use: Not Currently    Comment: rare   Drug use: No   Sexual activity: Not on file  Other Topics Concern   Not on file  Social History Narrative   Not on file   Social Determinants of Health   Financial Resource Strain: Not on file  Food Insecurity: Not on file  Transportation Needs: Not on file  Physical Activity: Not on file  Stress: Not on file  Social Connections: Not on file  Intimate Partner Violence: Not on file    Review of Systems: See HPI, otherwise negative ROS  Physical Exam: There were no vitals taken for this visit. General:   Alert,  pleasant and cooperative in NAD Head:  Normocephalic and atraumatic. Lungs:  Clear  to auscultation.    Heart:  Regular rate and rhythm.   Impression/Plan: Jacqueline Matthews is here for ophthalmic surgery.  Risks, benefits, limitations, and alternatives regarding ophthalmic surgery have been reviewed with the patient.  Questions have been answered.  All parties agreeable.   Lockie Mola, MD  05/13/2023, 8:02 AM

## 2023-05-13 NOTE — Op Note (Signed)
OPERATIVE NOTE  Jacqueline Matthews [id number] 05/13/2023   PREOPERATIVE DIAGNOSIS:  Nuclear sclerotic cataract left eye. H25.12   POSTOPERATIVE DIAGNOSIS:    Nuclear sclerotic cataract left eye.     PROCEDURE:  Phacoemusification with posterior chamber intraocular lens placement of the left eye  Ultrasound time: Procedure(s): CATARACT EXTRACTION PHACO AND INTRAOCULAR LENS PLACEMENT (IOC) LEFT 6.64 00:33.4 (Left)  LENS:   Implant Name Type Inv. Item Serial No. Manufacturer Lot No. LRB No. Used Action  LENS IOL TECNIS EYHANCE 22.0 - [id number] Intraocular Lens LENS IOL TECNIS EYHANCE 22.0 [id number] [employer]  Left 1 Implanted      SURGEON:  Deirdre Evener, MD   ANESTHESIA:  Topical with tetracaine drops and 2% Xylocaine jelly, augmented with 1% preservative-free intracameral lidocaine.    COMPLICATIONS:  None.   DESCRIPTION OF PROCEDURE:  The patient was identified in the holding room and transported to the operating room and placed in the supine position under the operating microscope.  The left eye was identified as the operative eye and it was prepped and draped in the usual sterile ophthalmic fashion.   A 1 millimeter clear-corneal paracentesis was made at the 1:30 position.  0.5 ml of preservative-free 1% lidocaine was injected into the anterior chamber.  The anterior chamber was filled with Viscoat viscoelastic.  A 2.4 millimeter keratome was used to make a near-clear corneal incision at the 10:30 position.  .  A curvilinear capsulorrhexis was made with a cystotome and capsulorrhexis forceps.  Balanced salt solution was used to hydrodissect and hydrodelineate the nucleus.   Phacoemulsification was then used in stop and chop fashion to remove the lens nucleus and epinucleus.  The remaining cortex was then removed using the irrigation and aspiration handpiece. Provisc was then placed into the capsular bag to distend it for lens placement.  A lens was then injected into the  capsular bag.  The remaining viscoelastic was aspirated.   Wounds were hydrated with balanced salt solution.  The anterior chamber was inflated to a physiologic pressure with balanced salt solution.  No wound leaks were noted. Cefuroxime 0.1 ml of a 10mg /ml solution was injected into the anterior chamber for a dose of 1 mg of intracameral antibiotic at the completion of the case.   Timolol and Brimonidine drops were applied to the eye.  The patient was taken to the recovery room in stable condition without complications of anesthesia or surgery.  Baer Hinton 05/13/2023, 9:13 AM

## 2023-05-13 NOTE — Transfer of Care (Signed)
Immediate Anesthesia Transfer of Care Note  Patient: Jacqueline Matthews  Procedure(s) Performed: CATARACT EXTRACTION PHACO AND INTRAOCULAR LENS PLACEMENT (IOC) LEFT 6.64 00:33.4 (Left)  Patient Location: PACU  Anesthesia Type: MAC  Level of Consciousness: awake, alert  and patient cooperative  Airway and Oxygen Therapy: Patient Spontanous Breathing and Patient connected to supplemental oxygen  Post-op Assessment: Post-op Vital signs reviewed, Patient's Cardiovascular Status Stable, Respiratory Function Stable, Patent Airway and No signs of Nausea or vomiting  Post-op Vital Signs: Reviewed and stable  Complications: No notable events documented.

## 2023-05-14 ENCOUNTER — Encounter: Payer: Self-pay | Admitting: Ophthalmology

## 2023-07-09 ENCOUNTER — Ambulatory Visit (INDEPENDENT_AMBULATORY_CARE_PROVIDER_SITE_OTHER): Payer: Medicare Other | Admitting: Physician Assistant

## 2023-07-09 ENCOUNTER — Encounter: Payer: Self-pay | Admitting: Physician Assistant

## 2023-07-09 VITALS — BP 128/72 | HR 97 | Temp 97.7°F | Ht 59.0 in | Wt 118.0 lb

## 2023-07-09 DIAGNOSIS — E785 Hyperlipidemia, unspecified: Secondary | ICD-10-CM | POA: Insufficient documentation

## 2023-07-09 DIAGNOSIS — M154 Erosive (osteo)arthritis: Secondary | ICD-10-CM | POA: Diagnosis not present

## 2023-07-09 DIAGNOSIS — R202 Paresthesia of skin: Secondary | ICD-10-CM

## 2023-07-09 DIAGNOSIS — Z7689 Persons encountering health services in other specified circumstances: Secondary | ICD-10-CM

## 2023-07-09 DIAGNOSIS — Z1321 Encounter for screening for nutritional disorder: Secondary | ICD-10-CM

## 2023-07-09 DIAGNOSIS — Z780 Asymptomatic menopausal state: Secondary | ICD-10-CM

## 2023-07-09 DIAGNOSIS — M159 Polyosteoarthritis, unspecified: Secondary | ICD-10-CM | POA: Insufficient documentation

## 2023-07-09 DIAGNOSIS — M8580 Other specified disorders of bone density and structure, unspecified site: Secondary | ICD-10-CM | POA: Insufficient documentation

## 2023-07-09 DIAGNOSIS — E559 Vitamin D deficiency, unspecified: Secondary | ICD-10-CM

## 2023-07-09 NOTE — Progress Notes (Signed)
Date:  07/09/2023   Name:  Jacqueline Matthews   DOB:  04/26/1941   MRN:  [id number]   Chief Complaint: Establish Care, Shoulder Pain, and Hand Pain (X several years, Right pointer fing is swollen,can't bind it fully )  Shoulder Pain  The pain is present in the right shoulder and back. This is a new problem. The current episode started more than 1 year ago. There has been a history of trauma (has fallen 3 times). Episode frequency: comes and goes. The pain is at a severity of 6/10. The pain is moderate. Associated symptoms include tingling. Pertinent negatives include no fever. The symptoms are aggravated by activity (moving around). She has tried acetaminophen for the symptoms. The treatment provided mild relief.   *This visit completed with assistance from Spanish interpreter Albin Felling*  Jacqueline Matthews is a delightful 82 y.o. Spanish-speaking femaleemale with history of osteopenia, osteoarthritis, and HLD who presents new to our clinic joined by her daughter Jacqueline Matthews today.  Her main reason for visit is to establish care, as her previous provider was about an hour drive from her home, but she would also like to see if there is anything that can be done to relieve some of her joint pain and stiffness.  I have reviewed x-rays from November 2023 of the right shoulder, right knee, and right hand which show varying degrees of arthritis.  She is currently using Tylenol and Voltaren gel, but no oral NSAIDs.  Kidney function was good on the most recent CMP available to me which was in 2018.  Incidentally, she tells me she has been having dreams that she has cancer, despite normal cancer screenings to-date and no family history of cancer.  In fact, her parents and grandparents lived into their late 90s/early 61's   Medication list has been reviewed and updated.  Current Meds  Medication Sig   acetaminophen (TYLENOL) 500 MG tablet Take 500-1,000 mg by mouth every 6 (six) hours as needed (for pain.).    alendronate (FOSAMAX)  70 MG tablet Take 70 mg by mouth once a week. Take with a full glass of water on an empty stomach.   COLLAGEN PO Take 30 mg by mouth daily.   diclofenac Sodium (VOLTAREN) 1 % GEL Apply topically 4 (four) times daily.   HM LIDOCAINE PATCH EX Apply 5 % topically.   pravastatin (PRAVACHOL) 40 MG tablet Take 80 mg by mouth at bedtime.   [DISCONTINUED] ERGOCALCIFEROL PO Take 1,250 mg by mouth daily.     Review of Systems  Constitutional:  Positive for fatigue. Negative for fever.  Respiratory:  Negative for chest tightness and shortness of breath.   Cardiovascular:  Negative for chest pain and palpitations.  Gastrointestinal:  Negative for abdominal pain.  Musculoskeletal:  Positive for arthralgias.  Neurological:  Positive for tingling.    Patient Active Problem List   Diagnosis Date Noted   Osteopenia after menopause 07/09/2023   Hyperlipidemia 07/09/2023   Generalized osteoarthritis of multiple sites 07/09/2023   Erosive osteoarthritis of right hand 07/09/2023    Allergies  Allergen Reactions   Meloxicam Nausea Only    Upset stomach     There is no immunization history on file for this patient.  Past Surgical History:  Procedure Laterality Date   CATARACT EXTRACTION W/PHACO Right 04/29/2023   Procedure: CATARACT EXTRACTION PHACO AND INTRAOCULAR LENS PLACEMENT (IOC) RIGHT 6.40 00:436.;  Surgeon: Lockie Mola, MD;  Location: [facility];  Service: Ophthalmology;  Laterality: Right;   CATARACT  EXTRACTION W/PHACO Left 05/13/2023   Procedure: CATARACT EXTRACTION PHACO AND INTRAOCULAR LENS PLACEMENT (IOC) LEFT 6.64 00:33.4;  Surgeon: Lockie Mola, MD;  Location: [facility];  Service: Ophthalmology;  Laterality: Left;   CHOLECYSTECTOMY     COLONOSCOPY  2000   [place]   INGUINAL HERNIA REPAIR Right 12/30/2016   Procedure: HERNIA REPAIR INGUINAL ADULT;  Surgeon: Nadeen Landau, MD;  Location: [facility];  Service: General;  Laterality: Right;   VARICOSE  VEIN SURGERY      Social History   Tobacco Use   Smoking status: Never   Smokeless tobacco: Never  Vaping Use   Vaping status: Never Used  Substance Use Topics   Alcohol use: Not Currently    Comment: rare   Drug use: No    Family History  Problem Relation Age of Onset   Heart attack Father    Breast cancer Neg Hx         07/09/2023    9:44 AM  GAD 7 : Generalized Anxiety Score  Nervous, Anxious, on Edge 0  Control/stop worrying 0  Worry too much - different things 0  Trouble relaxing 0  Restless 1  Easily annoyed or irritable 0  Afraid - awful might happen 0  Total GAD 7 Score 1  Anxiety Difficulty Not difficult at all       07/09/2023    9:44 AM  Depression screen PHQ 2/9  Decreased Interest 0  Down, Depressed, Hopeless 1  PHQ - 2 Score 1  Altered sleeping 1  Tired, decreased energy 1  Change in appetite 0  Feeling bad or failure about yourself  0  Trouble concentrating 0  Moving slowly or fidgety/restless 0  Suicidal thoughts 0  PHQ-9 Score 3  Difficult doing work/chores Somewhat difficult    BP Readings from Last 3 Encounters:  07/09/23 128/72  05/13/23 114/61  04/29/23 (!) 115/57    Wt Readings from Last 3 Encounters:  07/09/23 118 lb (53.5 kg)  05/13/23 117 lb 4.8 oz (53.2 kg)  04/29/23 118 lb 8 oz (53.8 kg)    BP 128/72   Pulse 97   Temp 97.7 F (36.5 C) (Oral)   Ht 4\' 11"  (1.499 m)   Wt 118 lb (53.5 kg)   SpO2 99%   BMI 23.83 kg/m   Physical Exam Vitals and nursing note reviewed.  Constitutional:      Appearance: Normal appearance.  Neck:     Vascular: No carotid bruit.  Cardiovascular:     Rate and Rhythm: Normal rate and regular rhythm.     Heart sounds: No murmur heard.    No friction rub. No gallop.  Pulmonary:     Effort: Pulmonary effort is normal.     Breath sounds: Normal breath sounds.  Abdominal:     General: There is no distension.  Musculoskeletal:        General: Normal range of motion.  Skin:    General:  Skin is warm and dry.  Neurological:     Mental Status: She is alert and oriented to person, place, and time.     Gait: Gait is intact.  Psychiatric:        Mood and Affect: Mood and affect normal.     Recent Labs     Component Value Date/Time   NA 140 12/26/2016 1025   K 4.1 12/26/2016 1025   CL 104 12/26/2016 1025   CO2 30 12/26/2016 1025   GLUCOSE 80 12/26/2016 1025  BUN 10 12/26/2016 1025   CREATININE 0.60 12/26/2016 1025   CALCIUM 9.9 12/26/2016 1025   PROT 7.9 12/26/2016 1025   ALBUMIN 4.5 12/26/2016 1025   AST 23 12/26/2016 1025   ALT 19 12/26/2016 1025   ALKPHOS 73 12/26/2016 1025   BILITOT 0.7 12/26/2016 1025   GFRNONAA >60 12/26/2016 1025   GFRAA >60 12/26/2016 1025    Lab Results  Component Value Date   WBC 7.8 12/26/2016   HGB 13.6 12/26/2016   HCT 42.3 12/26/2016   MCV 86.0 12/26/2016   PLT 256 12/26/2016   No results found for: "HGBA1C" No results found for: "CHOL", "HDL", "LDLCALC", "LDLDIRECT", "TRIG", "CHOLHDL" No results found for: "TSH"   Assessment and Plan:  1. Encounter to establish care New patient in great health for age.  Normal baseline exam.  Will check some baseline labs  2. Generalized osteoarthritis of multiple sites Checking renal function today as I think she may be a candidate for celecoxib.  We will have to consider risks and benefits in this 82 year old patient with HLD but no CAD. - CBC with Differential/Platelet - Comprehensive metabolic panel - TSH  3. Erosive osteoarthritis of right hand Checking renal function today as I think she may be a candidate for celecoxib.  We will have to consider risks and benefits in this 82 year old patient with HLD but no CAD. - CBC with Differential/Platelet - Comprehensive metabolic panel - TSH  4. Hyperlipidemia, unspecified hyperlipidemia type Check fasting lipids today - Lipid panel  5. Osteopenia after menopause Check baseline labs and vitamin D level - CBC with  Differential/Platelet - Comprehensive metabolic panel - TSH - VITAMIN D 25 Hydroxy (Vit-D Deficiency, Fractures)  6. Vitamin D deficiency Check vitamin D on current supplementation and adjust dose if necessary - VITAMIN D 25 Hydroxy (Vit-D Deficiency, Fractures)  7. Encounter for vitamin deficiency screening Check for vitamin deficiencies as below - VITAMIN D 25 Hydroxy (Vit-D Deficiency, Fractures) - B12 and Folate Panel  8. Paresthesia Check labs as below for intermittent tingling - CBC with Differential/Platelet - Comprehensive metabolic panel - TSH - B12 and Folate Panel   Return in about 3 months (around 10/09/2023) for OV f/u chronic conditions.    Alvester Morin, PA-C, DMSc, Nutritionist [facility] [phone]

## 2023-07-09 NOTE — Patient Instructions (Signed)
-  It was a pleasure to see you today! Please review your visit summary for helpful information -Lab results are usually available within 1-2 days and we will call once reviewed -I would encourage you to follow your care via MyChart where you can access lab results, notes, messages, and more -If you feel that we did a nice job today, please complete your after-visit survey and leave us a Google review! Your CMA today was Kieandra and your provider was Dan Hikaru Delorenzo, PA-C, DMSc -Please return for follow-up in about 3 months  

## 2023-07-21 ENCOUNTER — Ambulatory Visit: Payer: Self-pay | Admitting: *Deleted

## 2023-07-21 NOTE — Telephone Encounter (Signed)
Summary: tested positive for covid   Patient's granddaughter, Jacqueline Matthews has called in regards to patient. Jacqueline Matthews is [place], not with patient but states that patient tested positive for covid with rapid test this morning at home, per granddaughter patient is having body aches, congestion & cough, no fever. Patients granddaughter wanted to know if there is any kind of medication OTC to get for patient or if she can do anything, with her age? Please advise.   Patient's callback Jacqueline Matthews (Granddaughter): # [phone]          Called granddaughter, she is not with pt. States her mother is with pt at number in demographics. Attempted to reach, left message to call back. Assisted by Catalina Gravel  # [phone]

## 2023-07-21 NOTE — Telephone Encounter (Signed)
Second attempt to reach [place], daughter. Used Research officer, trade union, Nurse, mental health # [phone]. Line is busy.

## 2023-07-21 NOTE — Telephone Encounter (Signed)
Third attempt to reach daughter, Byrd Hesselbach. Used Spanish [place], #[phone]. Line is busy.

## 2023-07-22 ENCOUNTER — Ambulatory Visit: Payer: Self-pay | Admitting: *Deleted

## 2023-07-22 NOTE — Telephone Encounter (Signed)
Noted  PEC told pt things to do for at home care.  KP

## 2023-07-22 NOTE — Telephone Encounter (Signed)
  Chief Complaint: Covid Positive Symptoms: Cough, scant amounts yellowish phlegm,runny nose, mild sore throat, body aches, fatigue Frequency: Tested positive yesterday, symptoms onset 4 days ago Pertinent Negatives: Patient denies fever, SOB Disposition: [] ED /[] Urgent Care (no appt availability in office) / [] Appointment(In office/virtual)/ []  [facility]/ [x] Home Care/ [] Refused Recommended Disposition /[]  Mobile Bus/ []  Follow-up with PCP Additional Notes: Pt's daughter Byrd Hesselbach calling, pt present. Home care advise provided, self isolation guidelines reviewed, verbalizes understanding.  Pt is NOT interested in oral anti-viral. Considered high risk, routing to practice for PCPs review. Reason for Disposition  [1] HIGH RISK patient (e.g., weak immune system, age > 64 years, obesity with BMI 30 or higher, pregnant, chronic lung disease or other chronic medical condition) AND [2] COVID symptoms (e.g., cough, fever)  (Exceptions: Already seen by PCP and no new or worsening symptoms.)  Answer Assessment - Initial Assessment Questions 1. COVID-19 DIAGNOSIS: "How do you know that you have COVID?" (e.g., positive lab test or self-test, diagnosed by doctor or NP/PA, symptoms after exposure).     Home test yesterday 2. COVID-19 EXPOSURE: "Was there any known exposure to COVID before the symptoms began?" CDC Definition of close contact: within 6 feet (2 meters) for a total of 15 minutes or more over a 24-hour period.      Unsure 3. ONSET: "When did the COVID-19 symptoms start?"      4 days ago 4. WORST SYMPTOM: "What is your worst symptom?" (e.g., cough, fever, shortness of breath, muscle aches)     Cough 5. COUGH: "Do you have a cough?" If Yes, ask: "How bad is the cough?"       Scant  phlegm, yellow. 6. FEVER: "Do you have a fever?" If Yes, ask: "What is your temperature, how was it measured, and when did it start?"     No 7. RESPIRATORY STATUS: "Describe your breathing?" (e.g.,  normal; shortness of breath, wheezing, unable to speak)      No 8. BETTER-SAME-WORSE: "Are you getting better, staying the same or getting worse compared to yesterday?"  If getting worse, ask, "In what way?"     NA 9. OTHER SYMPTOMS: "Do you have any other symptoms?"  (e.g., chills, fatigue, headache, loss of smell or taste, muscle pain, sore throat)     Mild sore throat, muscle aches,runny nose, fatigued 10. HIGH RISK DISEASE: "Do you have any chronic medical problems?" (e.g., asthma, heart or lung disease, weak immune system, obesity, etc.)       Yes 11. VACCINE: "Have you had the COVID-19 vaccine?" If Yes, ask: "Which one, how many shots, when did you get it?"       Last year 2 shots  Protocols used: Coronavirus (COVID-19) Diagnosed or Suspected-A-AH

## 2023-07-22 NOTE — Telephone Encounter (Signed)
FYI  KP

## 2023-09-07 ENCOUNTER — Telehealth: Payer: Self-pay | Admitting: Physician Assistant

## 2023-09-07 NOTE — Telephone Encounter (Signed)
Copied from CRM [id number]. Topic: Medicare AWV >> Sep 07, 2023  2:24 PM Payton Doughty wrote: Reason for CRM: Called LVM 09/07/2023 to schedule AWV   Verlee Rossetti; Care Guide Ambulatory Clinical Support [place] [facility] Direct Dial: [phone]

## 2023-10-09 ENCOUNTER — Ambulatory Visit: Payer: Medicare Other | Admitting: Physician Assistant

## 2023-10-12 ENCOUNTER — Encounter: Payer: Self-pay | Admitting: Physician Assistant

## 2023-10-12 ENCOUNTER — Ambulatory Visit (INDEPENDENT_AMBULATORY_CARE_PROVIDER_SITE_OTHER): Payer: Medicare Other | Admitting: Physician Assistant

## 2023-10-12 VITALS — BP 118/66 | HR 76 | Ht 59.0 in | Wt 117.0 lb

## 2023-10-12 DIAGNOSIS — M8580 Other specified disorders of bone density and structure, unspecified site: Secondary | ICD-10-CM | POA: Diagnosis not present

## 2023-10-12 DIAGNOSIS — Z780 Asymptomatic menopausal state: Secondary | ICD-10-CM

## 2023-10-12 DIAGNOSIS — E559 Vitamin D deficiency, unspecified: Secondary | ICD-10-CM | POA: Diagnosis not present

## 2023-10-12 DIAGNOSIS — Z23 Encounter for immunization: Secondary | ICD-10-CM | POA: Diagnosis not present

## 2023-10-12 DIAGNOSIS — M159 Polyosteoarthritis, unspecified: Secondary | ICD-10-CM | POA: Diagnosis not present

## 2023-10-12 MED ORDER — CELECOXIB 200 MG PO CAPS
200.0000 mg | ORAL_CAPSULE | Freq: Every day | ORAL | 2 refills | Status: DC | PRN
Start: 2023-10-12 — End: 2023-12-15

## 2023-10-12 NOTE — Progress Notes (Signed)
Date:  10/12/2023   Name:  Jacqueline Matthews   DOB:  10-19-1941   MRN:  [id number]   Chief Complaint: Hyperlipidemia (Used interpreter services - #[id number]) and vit d def  HPI [name] returns for 33-month follow-up on chronic conditions, joined by her daughter and also assisted by interpreter today.  Last visit we found that her OTC dose of vitamin D 1000 IU daily was insufficient to correct her vitamin D deficiency, so I advised increasing her dose to 5000 IU daily which she is taking with good compliance and no issues.  We also discussed possibly using Celebrex for her arthritis of multiple joints including the right knee, right shoulder, and hands but wanted to first make sure she had good renal function.  All labs were normal, and she may be interested in pursuing Celebrex for this purpose.  Diagnosed with osteopenia via DEXA in 2021 and again on repeat 06/17/2022.  Taking Fosamax for this in addition to her vitamin D supplement  Medication list has been reviewed and updated.  Current Meds  Medication Sig   acetaminophen (TYLENOL) 500 MG tablet Take 500-1,000 mg by mouth every 6 (six) hours as needed (for pain.).    alendronate (FOSAMAX) 70 MG tablet Take 70 mg by mouth once a week. Take with a full glass of water on an empty stomach.   celecoxib (CELEBREX) 200 MG capsule Take 1 capsule (200 mg total) by mouth daily as needed (for joint pain). Take with food.  Do not take with other NSAIDs (ibuprofen, naproxen, aspirin, etc.)   COLLAGEN PO Take 30 mg by mouth daily.   diclofenac Sodium (VOLTAREN) 1 % GEL Apply topically 4 (four) times daily.   HM LIDOCAINE PATCH EX Apply 5 % topically.   pravastatin (PRAVACHOL) 40 MG tablet Take 80 mg by mouth at bedtime.   VITAMIN D PO Take 5,000 Int'l Units by mouth daily.     Review of Systems  Constitutional:  Positive for fatigue. Negative for fever.  Respiratory:  Negative for chest tightness and shortness of breath.   Cardiovascular:  Negative for  chest pain and palpitations.  Gastrointestinal:  Negative for abdominal pain.  Musculoskeletal:  Positive for arthralgias.    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    Allergies  Allergen Reactions   Meloxicam Nausea Only    Upset stomach    Immunization History  Administered Date(s) Administered   Fluad Trivalent(High Dose 65+) 10/12/2023   Pneumococcal Conjugate-13 12/15/2014, 04/23/2015   Pneumococcal Polysaccharide-23 09/18/2009   Tdap 02/01/2014   Zoster Recombinant(Shingrix) 06/26/2014    Past Surgical History:  Procedure Laterality Date   CATARACT EXTRACTION W/PHACO Right 04/29/2023   Procedure: CATARACT EXTRACTION PHACO AND INTRAOCULAR LENS PLACEMENT (IOC) RIGHT 6.40 00:436.;  Surgeon: Lockie Mola, MD;  Location: [facility];  Service: Ophthalmology;  Laterality: Right;   CATARACT EXTRACTION W/PHACO Left 05/13/2023   Procedure: CATARACT EXTRACTION PHACO AND INTRAOCULAR LENS PLACEMENT (IOC) LEFT 6.64 00:33.4;  Surgeon: Lockie Mola, MD;  Location: [facility];  Service: Ophthalmology;  Laterality: Left;   CHOLECYSTECTOMY     COLONOSCOPY  2000   [place]   INGUINAL HERNIA REPAIR Right 12/30/2016   Procedure: HERNIA REPAIR INGUINAL ADULT;  Surgeon: Nadeen Landau, MD;  Location: [facility];  Service: General;  Laterality: Right;   VARICOSE VEIN SURGERY      Social History   Tobacco Use   Smoking status: Never   Smokeless tobacco: Never  Vaping Use   Vaping status: Never Used  Substance Use Topics   Alcohol use: Not Currently    Comment: rare   Drug use: No    Family History  Problem Relation Age of Onset   Heart attack Father    Breast cancer Neg Hx         07/09/2023    9:44 AM  GAD 7 : Generalized  Anxiety Score  Nervous, Anxious, on Edge 0  Control/stop worrying 0  Worry too much - different things 0  Trouble relaxing 0  Restless 1  Easily annoyed or irritable 0  Afraid - awful might happen 0  Total GAD 7 Score 1  Anxiety Difficulty Not difficult at all       07/09/2023    9:44 AM  Depression screen PHQ 2/9  Decreased Interest 0  Down, Depressed, Hopeless 1  PHQ - 2 Score 1  Altered sleeping 1  Tired, decreased energy 1  Change in appetite 0  Feeling bad or failure about yourself  0  Trouble concentrating 0  Moving slowly or fidgety/restless 0  Suicidal thoughts 0  PHQ-9 Score 3  Difficult doing work/chores Somewhat difficult    BP Readings from Last 3 Encounters:  10/12/23 118/66  07/09/23 128/72  05/13/23 114/61    Wt Readings from Last 3 Encounters:  10/12/23 117 lb (53.1 kg)  07/09/23 118 lb (53.5 kg)  05/13/23 117 lb 4.8 oz (53.2 kg)    BP 118/66   Pulse 76   Ht 4\' 11"  (1.499 m)   Wt 117 lb (53.1 kg)   SpO2 98%   BMI 23.63 kg/m   Physical Exam Vitals and nursing note reviewed.  Constitutional:      Appearance: Normal appearance.  Cardiovascular:     Rate and Rhythm: Normal rate.  Pulmonary:     Effort: Pulmonary effort is normal.  Abdominal:     General: There is no distension.  Musculoskeletal:        General: Normal range of motion.  Skin:    General: Skin is warm and dry.  Neurological:     Mental Status: She is alert and oriented to person, place, and time.     Gait: Gait is intact.  Psychiatric:        Mood and Affect: Mood and affect normal.     Recent Labs     Component Value Date/Time   NA 141 07/09/2023 1042   K 4.7 07/09/2023 1042   CL 104 07/09/2023 1042   CO2 24 07/09/2023 1042   GLUCOSE 84 07/09/2023 1042   GLUCOSE 80 12/26/2016 1025   BUN 12 07/09/2023 1042   CREATININE 0.59 07/09/2023 1042   CALCIUM 10.5 (H) 07/09/2023 1042   PROT 7.0 07/09/2023 1042   ALBUMIN 4.4 07/09/2023 1042   AST 24 07/09/2023 1042    ALT 21 07/09/2023 1042   ALKPHOS 88 07/09/2023 1042   BILITOT 0.3 07/09/2023 1042   GFRNONAA >60 12/26/2016 1025   GFRAA >60 12/26/2016 1025    Lab Results  Component Value Date   WBC 8.0 07/09/2023   HGB 13.8 07/09/2023   HCT 43.3 07/09/2023   MCV 88 07/09/2023   PLT 294 07/09/2023   No results found for: "HGBA1C" Lab Results  Component Value Date   CHOL 194 07/09/2023  HDL 64 07/09/2023   LDLCALC 95 07/09/2023   TRIG 211 (H) 07/09/2023   CHOLHDL 3.0 07/09/2023   Lab Results  Component Value Date   TSH 2.500 07/09/2023     Assessment and Plan:  1. Generalized osteoarthritis of multiple sites Will try celecoxib as below.  May use in combination with acetaminophen but not with other NSAIDs.  Patient and daughter verbalized understanding.  Will repeat BMP next visit. - celecoxib (CELEBREX) 200 MG capsule; Take 1 capsule (200 mg total) by mouth daily as needed (for joint pain). Take with food.  Do not take with other NSAIDs (ibuprofen, naproxen, aspirin, etc.)  Dispense: 30 capsule; Refill: 2  2. Osteopenia after menopause Continue Fosamax and vitamin D supplementation.  3. Vitamin D deficiency Continue vitamin D 5000 IU daily until next visit when we will recheck vitamin D.  4. Need for influenza vaccination Flu shot given today - Flu Vaccine Trivalent High Dose (Fluad)     Return in about 2 months (around 12/12/2023) for OV f/u Vit D and arthritis.    Alvester Morin, PA-C, DMSc, Nutritionist [facility] [phone]

## 2023-10-28 ENCOUNTER — Ambulatory Visit (INDEPENDENT_AMBULATORY_CARE_PROVIDER_SITE_OTHER): Payer: Medicare Other | Admitting: Physician Assistant

## 2023-10-28 ENCOUNTER — Encounter: Payer: Self-pay | Admitting: Physician Assistant

## 2023-10-28 VITALS — BP 138/74 | HR 79 | Temp 98.3°F | Wt 118.0 lb

## 2023-10-28 DIAGNOSIS — M159 Polyosteoarthritis, unspecified: Secondary | ICD-10-CM | POA: Diagnosis not present

## 2023-10-28 MED ORDER — LIDOCAINE 4 % EX PTCH
1.0000 | MEDICATED_PATCH | Freq: Every day | CUTANEOUS | 5 refills | Status: AC | PRN
Start: 2023-10-28 — End: ?

## 2023-10-28 MED ORDER — DICLOFENAC SODIUM 1 % EX GEL
2.0000 g | Freq: Four times a day (QID) | CUTANEOUS | 3 refills | Status: DC | PRN
Start: 2023-10-28 — End: 2024-03-31

## 2023-10-28 NOTE — Progress Notes (Signed)
Date:  10/28/2023   Name:  Jacqueline Matthews   DOB:  1941-03-10   MRN:  [id number]   Chief Complaint: Knee Pain and Shoulder Pain  Knee Pain  Incident onset: X3 weeks. There was no injury mechanism. The pain is present in the right knee. Quality: sharp. The pain is at a severity of 10/10. The pain is moderate. The pain has been Constant since onset. Associated symptoms include tingling. She reports no foreign bodies present. The symptoms are aggravated by movement and weight bearing. She has tried acetaminophen (celebrex) for the symptoms. The treatment provided mild relief.  Shoulder Pain  The pain is present in the right shoulder. This is a new problem. Episode onset: X3 weeks. There has been no history of extremity trauma. The problem occurs constantly. The quality of the pain is described as sharp. The pain is at a severity of 8/10. The pain is moderate. Associated symptoms include tingling. She has tried OTC ointments, acetaminophen, movement and heat for the symptoms. The treatment provided mild relief. Her past medical history is significant for osteoarthritis.   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 Currently on celecoxib daily and topical diclofenac PRN.  She has been tolerating the celecoxib well since it was prescribed at her last visit 10/12/2023, but for the last week she has switched to Tylenol.  She did not realize she could use both.    Last knee XR Nov 2023 with "Moderate to severe patellofemoral and mild-to-moderate medial compartment osteoarthritis."  Requesting refill on lidocaine patches.  Also asks about using a walker instead of her cane.   Medication list has been reviewed and updated.  Current Meds  Medication Sig   acetaminophen (TYLENOL) 500 MG tablet Take 500-1,000 mg by mouth every 6 (six) hours as needed (for pain.).    alendronate (FOSAMAX) 70 MG tablet Take 70 mg by mouth  once a week. Take with a full glass of water on an empty stomach.   celecoxib (CELEBREX) 200 MG capsule Take 1 capsule (200 mg total) by mouth daily as needed (for joint pain). Take with food.  Do not take with other NSAIDs (ibuprofen, naproxen, aspirin, etc.)   pravastatin (PRAVACHOL) 80 MG tablet SMARTSIG:1 Tablet(s) By Mouth Every Evening   VITAMIN D PO Take 5,000 Int'l Units by mouth daily.   [DISCONTINUED] COLLAGEN PO Take 30 mg by mouth daily.   [DISCONTINUED] loratadine (CLARITIN) 10 MG tablet Take 10 mg by mouth daily.     Review of Systems  Neurological:  Positive for tingling.    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    Allergies  Allergen Reactions   Meloxicam Nausea Only    Upset stomach    Immunization History  Administered Date(s) Administered   Fluad Trivalent(High Dose 65+) 10/12/2023   Pneumococcal Conjugate-13 12/15/2014, 04/23/2015   Pneumococcal Polysaccharide-23 09/18/2009   Tdap 02/01/2014   Zoster Recombinant(Shingrix) 06/26/2014    Past Surgical History:  Procedure Laterality Date   CATARACT EXTRACTION W/PHACO Right 04/29/2023   Procedure: CATARACT EXTRACTION PHACO AND INTRAOCULAR LENS PLACEMENT (IOC) RIGHT 6.40 00:436.;  Surgeon: Lockie Mola, MD;  Location: [facility];  Service: Ophthalmology;  Laterality: Right;   CATARACT EXTRACTION W/PHACO Left  05/13/2023   Procedure: CATARACT EXTRACTION PHACO AND INTRAOCULAR LENS PLACEMENT (IOC) LEFT 6.64 00:33.4;  Surgeon: Lockie Mola, MD;  Location: [facility];  Service: Ophthalmology;  Laterality: Left;   CHOLECYSTECTOMY     COLONOSCOPY  2000   [place]   INGUINAL HERNIA REPAIR Right 12/30/2016   Procedure: HERNIA REPAIR INGUINAL ADULT;  Surgeon: Nadeen Landau, MD;  Location: [facility];  Service: General;  Laterality: Right;   VARICOSE VEIN SURGERY      Social History   Tobacco Use   Smoking status: Never   Smokeless tobacco: Never  Vaping Use   Vaping status: Never Used  Substance Use Topics   Alcohol use: Not Currently    Comment: rare   Drug use: No    Family History  Problem Relation Age of Onset   Heart attack Father    Breast cancer Neg Hx         10/28/2023    3:59 PM 07/09/2023    9:44 AM  GAD 7 : Generalized Anxiety Score  Nervous, Anxious, on Edge 1 0  Control/stop worrying 0 0  Worry too much - different things 0 0  Trouble relaxing 0 0  Restless 0 1  Easily annoyed or irritable 0 0  Afraid - awful might happen 0 0  Total GAD 7 Score 1 1  Anxiety Difficulty Not difficult at all Not difficult at all       10/28/2023    3:59 PM 07/09/2023    9:44 AM  Depression screen PHQ 2/9  Decreased Interest 0 0  Down, Depressed, Hopeless 0 1  PHQ - 2 Score 0 1  Altered sleeping 0 1  Tired, decreased energy 0 1  Change in appetite 0 0  Feeling bad or failure about yourself  0 0  Trouble concentrating 0 0  Moving slowly or fidgety/restless 0 0  Suicidal thoughts 0 0  PHQ-9 Score 0 3  Difficult doing work/chores Not difficult at all Somewhat difficult    BP Readings from Last 3 Encounters:  10/28/23 138/74  10/12/23 118/66  07/09/23 128/72    Wt Readings from Last 3 Encounters:  10/28/23 118 lb (53.5 kg)  10/12/23 117 lb (53.1 kg)  07/09/23 118 lb (53.5 kg)    BP 138/74   Pulse 79   Temp 98.3 F (36.8 C) (Oral)   Wt 118 lb (53.5 kg)   SpO2 97%   BMI 23.83 kg/m   Physical Exam Vitals and nursing note reviewed.  Constitutional:      Appearance: Normal appearance.     Comments: Walks with cane, holds onto daughter for stability  Cardiovascular:     Rate and Rhythm: Normal rate.  Pulmonary:     Effort: Pulmonary effort is normal.  Abdominal:     General: There is no distension.   Musculoskeletal:        General: Normal range of motion.  Skin:    General: Skin is warm and dry.  Neurological:     Mental Status: She is alert and oriented to person, place, and time.     Gait: Gait is intact.  Psychiatric:        Mood and Affect: Mood and affect normal.     Recent Labs     Component Value Date/Time   NA 141 07/09/2023 1042   K 4.7 07/09/2023 1042   CL 104 07/09/2023 1042   CO2 24 07/09/2023 1042   GLUCOSE 84 07/09/2023 1042   GLUCOSE  80 12/26/2016 1025   BUN 12 07/09/2023 1042   CREATININE 0.59 07/09/2023 1042   CALCIUM 10.5 (H) 07/09/2023 1042   PROT 7.0 07/09/2023 1042   ALBUMIN 4.4 07/09/2023 1042   AST 24 07/09/2023 1042   ALT 21 07/09/2023 1042   ALKPHOS 88 07/09/2023 1042   BILITOT 0.3 07/09/2023 1042   GFRNONAA >60 12/26/2016 1025   GFRAA >60 12/26/2016 1025    Lab Results  Component Value Date   WBC 8.0 07/09/2023   HGB 13.8 07/09/2023   HCT 43.3 07/09/2023   MCV 88 07/09/2023   PLT 294 07/09/2023   No results found for: "HGBA1C" Lab Results  Component Value Date   CHOL 194 07/09/2023   HDL 64 07/09/2023   LDLCALC 95 07/09/2023   TRIG 211 (H) 07/09/2023   CHOLHDL 3.0 07/09/2023   Lab Results  Component Value Date   TSH 2.500 07/09/2023     Assessment and Plan:  1. Generalized osteoarthritis of multiple sites Continue celecoxib daily, may use Tylenol as needed in addition to this.  May also continue use of as needed diclofenac gel and lidocaine patches for additional relief.   She appears unstable in terms of her gait, recommended she use a walker which can be purchased at any medical supply store.  If not able to find relief with the above measures, will proceed with internal referral to my colleague Dr. Joseph Berkshire for consideration of joint injections.  Also briefly discussed possible future need for joint replacement, though this may not be ideal with her osteoporosis and advanced age.  - lidocaine (HM LIDOCAINE  PATCH) 4 %; Place 1 patch onto the skin daily as needed (for shoulder pain).  Dispense: 15 patch; Refill: 5 - diclofenac Sodium (VOLTAREN) 1 % GEL; Apply 2 g topically 4 (four) times daily as needed.  Dispense: 100 g; Refill: 3   Follow-up as scheduled with me in January   Alvester Morin, PA-C, DMSc, Nutritionist [facility] Primary Care and Sports Medicine [facility] [phone]

## 2023-12-14 ENCOUNTER — Ambulatory Visit: Payer: Medicare Other | Admitting: Physician Assistant

## 2023-12-15 ENCOUNTER — Ambulatory Visit (INDEPENDENT_AMBULATORY_CARE_PROVIDER_SITE_OTHER): Payer: Medicare Other | Admitting: Physician Assistant

## 2023-12-15 ENCOUNTER — Encounter: Payer: Self-pay | Admitting: Physician Assistant

## 2023-12-15 VITALS — BP 110/82 | HR 80 | Temp 98.7°F | Ht 59.0 in | Wt 120.0 lb

## 2023-12-15 DIAGNOSIS — M159 Polyosteoarthritis, unspecified: Secondary | ICD-10-CM | POA: Diagnosis not present

## 2023-12-15 DIAGNOSIS — M8580 Other specified disorders of bone density and structure, unspecified site: Secondary | ICD-10-CM | POA: Diagnosis not present

## 2023-12-15 DIAGNOSIS — E785 Hyperlipidemia, unspecified: Secondary | ICD-10-CM

## 2023-12-15 DIAGNOSIS — Z780 Asymptomatic menopausal state: Secondary | ICD-10-CM

## 2023-12-15 DIAGNOSIS — Z23 Encounter for immunization: Secondary | ICD-10-CM

## 2023-12-15 DIAGNOSIS — E559 Vitamin D deficiency, unspecified: Secondary | ICD-10-CM | POA: Diagnosis not present

## 2023-12-15 MED ORDER — CELECOXIB 200 MG PO CAPS: 200.0000 mg | ORAL_CAPSULE | Freq: Every day | ORAL | 3 refills | Status: DC | PRN

## 2023-12-15 MED ORDER — PRAVASTATIN SODIUM 80 MG PO TABS: 80.0000 mg | ORAL_TABLET | Freq: Every day | ORAL | 3 refills | Status: DC

## 2023-12-15 NOTE — Assessment & Plan Note (Signed)
 Repeat nonfasting lipids today.  Refilling pravastatin to be dispensed in 90-day supplies.

## 2023-12-15 NOTE — Assessment & Plan Note (Signed)
 Repeat vitamin D today, currently supplementing with 5000 IU daily

## 2023-12-15 NOTE — Assessment & Plan Note (Signed)
 Patient reports much improvement on daily celecoxib and topical diclofenac.  Will check CMP today, but to continue this regimen for now.

## 2023-12-15 NOTE — Assessment & Plan Note (Addendum)
 Repeat vitamin D today, currently supplementing with 5000 IU daily.  DEXA due July 2025

## 2023-12-15 NOTE — Progress Notes (Signed)
 Date:  12/15/2023   Name:  Jacqueline Matthews   DOB:  02/08/1941   MRN:  [id number]  **Today's visit completed with the assistance of Spanish translator Mauricio**  Chief Complaint: Arthritis (Shoulder pain, medication helps )  HPI Jacqueline Matthews returns for 99-month follow-up on chronic conditions including osteoarthritis of multiple sites, osteopenia, HLD, vitamin D  deficiency.  She has been doing well with daily celecoxib  and topical diclofenac , sometimes adds acetaminophen  if additional pain relief is needed.  To my surprise she presents for her appointment today with neither a walker nor cane, feels she is doing much better with her daily activities since the joint pain is controlled between celecoxib  and topical diclofenac , feels added stability in the knee joints where she has known osteoarthritis, and she is less worried about falling.    Continues on Fosamax once weekly, has about 3 months left on the current prescription.  Due for repeat DEXA in July 2025.    Medication list has been reviewed and updated.  Current Meds  Medication Sig   acetaminophen  (TYLENOL ) 500 MG tablet Take 500-1,000 mg by mouth every 6 (six) hours as needed (for pain.).    alendronate (FOSAMAX) 70 MG tablet Take 70 mg by mouth once a week. Take with a full glass of water on an empty stomach.   diclofenac  Sodium (VOLTAREN ) 1 % GEL Apply 2 g topically 4 (four) times daily as needed.   VITAMIN D  PO Take 5,000 Int'l Units by mouth daily.   [DISCONTINUED] celecoxib  (CELEBREX ) 200 MG capsule Take 1 capsule (200 mg total) by mouth daily as needed (for joint pain). Take with food.  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    Allergies  Allergen Reactions   Meloxicam Nausea Only    Upset stomach    Immunization History  Administered Date(s) Administered   Fluad Trivalent(High Dose 65+) 10/12/2023   Pneumococcal Conjugate-13 12/15/2014, 04/23/2015   Pneumococcal Polysaccharide-23 09/18/2009   Tdap 02/01/2014   Zoster Recombinant(Shingrix) 06/26/2014, 02/22/2021    Past Surgical History:  Procedure Laterality Date   CATARACT EXTRACTION W/PHACO Right 04/29/2023   Procedure: CATARACT EXTRACTION PHACO AND INTRAOCULAR LENS PLACEMENT (IOC) RIGHT 6.40 00:436.;  Surgeon: Mittie Gaskin, MD;  Location: [facility];  Service: Ophthalmology;  Laterality: Right;   CATARACT EXTRACTION W/PHACO Left 05/13/2023   Procedure: CATARACT EXTRACTION PHACO AND INTRAOCULAR LENS PLACEMENT (IOC) LEFT 6.64 00:33.4;  Surgeon: Mittie Gaskin, MD;  Location: [facility];  Service: Ophthalmology;  Laterality: Left;   CHOLECYSTECTOMY     COLONOSCOPY  2000   [place]   INGUINAL HERNIA REPAIR Right 12/30/2016   Procedure: HERNIA REPAIR INGUINAL ADULT;  Surgeon: Larinda Unknown Sharps, MD;  Location: [facility];  Service: General;  Laterality: Right;   VARICOSE VEIN SURGERY      Social History   Tobacco Use   Smoking status: Never   Smokeless tobacco: Never  Vaping Use   Vaping status: Never Used  Substance Use Topics   Alcohol use: Not Currently    Comment: rare   Drug use: No    Family History  Problem Relation  Age of Onset   Heart attack Father    Breast cancer Neg Hx         12/15/2023    1:33 PM 10/28/2023    3:59 PM 07/09/2023    9:44 AM  GAD 7 : Generalized Anxiety Score  Nervous, Anxious, on Edge 1 1 0  Control/stop worrying 0 0 0  Worry too much - different things 0 0 0  Trouble relaxing 0 0 0  Restless 0 0 1  Easily annoyed or irritable 0 0 0  Afraid - awful might happen 0 0 0   Total GAD 7 Score 1 1 1   Anxiety Difficulty Not difficult at all Not difficult at all Not difficult at all       12/15/2023    1:33 PM 10/28/2023    3:59 PM 07/09/2023    9:44 AM  Depression screen PHQ 2/9  Decreased Interest 0 0 0  Down, Depressed, Hopeless 0 0 1  PHQ - 2 Score 0 0 1  Altered sleeping  0 1  Tired, decreased energy  0 1  Change in appetite  0 0  Feeling bad or failure about yourself   0 0  Trouble concentrating  0 0  Moving slowly or fidgety/restless  0 0  Suicidal thoughts  0 0  PHQ-9 Score  0 3  Difficult doing work/chores  Not difficult at all Somewhat difficult    BP Readings from Last 3 Encounters:  12/15/23 110/82  10/28/23 138/74  10/12/23 118/66    Wt Readings from Last 3 Encounters:  12/15/23 120 lb (54.4 kg)  10/28/23 118 lb (53.5 kg)  10/12/23 117 lb (53.1 kg)    BP 110/82   Pulse 80   Temp 98.7 F (37.1 C)   Ht 4' 11 (1.499 m)   Wt 120 lb (54.4 kg)   SpO2 96%   BMI 24.24 kg/m   Physical Exam Vitals and nursing note reviewed.  Constitutional:      Appearance: Normal appearance.  Cardiovascular:     Rate and Rhythm: Normal rate.  Pulmonary:     Effort: Pulmonary effort is normal.  Abdominal:     General: There is no distension.  Musculoskeletal:        General: Normal range of motion.     Comments: Walks very well without assistance, good balance and coordination.  Skin:    General: Skin is warm and dry.  Neurological:     Mental Status: She is alert and oriented to person, place, and time.     Gait: Gait is intact.  Psychiatric:        Mood and Affect: Mood and affect normal.     Recent Labs     Component Value Date/Time   NA 141 07/09/2023 1042   K 4.7 07/09/2023 1042   CL 104 07/09/2023 1042   CO2 24 07/09/2023 1042   GLUCOSE 84 07/09/2023 1042   GLUCOSE 80 12/26/2016 1025   BUN 12 07/09/2023 1042   CREATININE 0.59 07/09/2023 1042   CALCIUM 10.5 (H) 07/09/2023 1042   PROT 7.0 07/09/2023 1042   ALBUMIN 4.4  07/09/2023 1042   AST 24 07/09/2023 1042   ALT 21 07/09/2023 1042   ALKPHOS 88 07/09/2023 1042   BILITOT 0.3 07/09/2023 1042   GFRNONAA >60 12/26/2016 1025   GFRAA >60 12/26/2016 1025    Lab Results  Component Value Date   WBC 8.0 07/09/2023   HGB 13.8 07/09/2023   HCT 43.3 07/09/2023  MCV 88 07/09/2023   PLT 294 07/09/2023   No results found for: HGBA1C Lab Results  Component Value Date   CHOL 194 07/09/2023   HDL 64 07/09/2023   LDLCALC 95 07/09/2023   TRIG 211 (H) 07/09/2023   CHOLHDL 3.0 07/09/2023   Lab Results  Component Value Date   TSH 2.500 07/09/2023     Assessment and Plan:  Generalized osteoarthritis of multiple sites Assessment & Plan: Patient reports much improvement on daily celecoxib  and topical diclofenac .  Will check CMP today, but to continue this regimen for now.  Orders: -     Comprehensive metabolic panel -     Celecoxib ; Take 1 capsule (200 mg total) by mouth daily as needed (for joint pain). Take with food.  Do not take with other NSAIDs (ibuprofen, naproxen, aspirin, etc.)  Dispense: 90 capsule; Refill: 3  Vitamin D  deficiency Assessment & Plan: Repeat vitamin D  today, currently supplementing with 5000 IU daily  Orders: -     VITAMIN D  25 Hydroxy (Vit-D Deficiency, Fractures)  Osteopenia after menopause Assessment & Plan: Repeat vitamin D  today, currently supplementing with 5000 IU daily.  DEXA due July 2025  Orders: -     VITAMIN D  25 Hydroxy (Vit-D Deficiency, Fractures)  Hyperlipidemia, unspecified hyperlipidemia type Assessment & Plan: Repeat nonfasting lipids today.  Refilling pravastatin  to be dispensed in 90-day supplies.  Orders: -     Pravastatin  Sodium; Take 1 tablet (80 mg total) by mouth daily.  Dispense: 90 tablet; Refill: 3 -     Lipid panel  Immunization due  Recommended RSV immunization at her local pharmacy.  [employer] brochure given to review.  Return in about 4 months (around 04/13/2024) for OV f/u chronic  conditions.    Rolan Hoyle, PA-C, DMSc, Nutritionist [facility] Primary Care and Sports Medicine MedCenter [facility] [phone]

## 2023-12-15 NOTE — Patient Instructions (Addendum)

## 2023-12-16 LAB — COMPREHENSIVE METABOLIC PANEL
ALT: 19 [IU]/L (ref 0–32)
AST: 25 [IU]/L (ref 0–40)
Albumin: 4.5 g/dL (ref 3.7–4.7)
Alkaline Phosphatase: 89 [IU]/L (ref 44–121)
BUN/Creatinine Ratio: 16 (ref 12–28)
BUN: 8 mg/dL (ref 8–27)
Bilirubin Total: <0.2 mg/dL (ref 0.0–1.2)
CO2: 24 mmol/L (ref 20–29)
Calcium: 10.4 mg/dL — ABNORMAL HIGH (ref 8.7–10.3)
Chloride: 106 mmol/L (ref 96–106)
Creatinine, Ser: 0.49 mg/dL — ABNORMAL LOW (ref 0.57–1.00)
Globulin, Total: 2.5 g/dL (ref 1.5–4.5)
Glucose: 86 mg/dL (ref 70–99)
Potassium: 5 mmol/L (ref 3.5–5.2)
Sodium: 142 mmol/L (ref 134–144)
Total Protein: 7 g/dL (ref 6.0–8.5)
eGFR: 94 mL/min/{1.73_m2} (ref >59)

## 2023-12-16 LAB — VITAMIN D 25 HYDROXY (VIT D DEFICIENCY, FRACTURES): Vit D, 25-Hydroxy: 53.9 ng/mL (ref 30.0–100.0)

## 2023-12-16 LAB — LIPID PANEL
Chol/HDL Ratio: 3 {ratio} (ref 0.0–4.4)
Cholesterol, Total: 181 mg/dL (ref 100–199)
HDL: 61 mg/dL (ref >39)
LDL Chol Calc (NIH): 92 mg/dL (ref 0–99)
Triglycerides: 162 mg/dL — ABNORMAL HIGH (ref 0–149)
VLDL Cholesterol Cal: 28 mg/dL (ref 5–40)

## 2024-03-31 ENCOUNTER — Encounter: Payer: Self-pay | Admitting: Physician Assistant

## 2024-03-31 ENCOUNTER — Ambulatory Visit
Admission: RE | Admit: 2024-03-31 | Discharge: 2024-03-31 | Disposition: A | Discharge status: Home / Self Care | Attending: Physician Assistant | Admitting: Physician Assistant

## 2024-03-31 ENCOUNTER — Ambulatory Visit
Admission: RE | Admit: 2024-03-31 | Discharge: 2024-03-31 | Disposition: A | Discharge status: Home / Self Care | Source: Ambulatory Visit | Attending: Physician Assistant | Admitting: Physician Assistant

## 2024-03-31 ENCOUNTER — Ambulatory Visit (INDEPENDENT_AMBULATORY_CARE_PROVIDER_SITE_OTHER): Admitting: Physician Assistant

## 2024-03-31 VITALS — BP 114/64 | HR 77 | Temp 97.9°F | Ht 59.0 in | Wt 117.0 lb

## 2024-03-31 DIAGNOSIS — G8929 Other chronic pain: Secondary | ICD-10-CM | POA: Diagnosis not present

## 2024-03-31 DIAGNOSIS — M25511 Pain in right shoulder: Secondary | ICD-10-CM

## 2024-03-31 DIAGNOSIS — Z780 Asymptomatic menopausal state: Secondary | ICD-10-CM

## 2024-03-31 DIAGNOSIS — S42031A Displaced fracture of lateral end of right clavicle, initial encounter for closed fracture: Secondary | ICD-10-CM | POA: Insufficient documentation

## 2024-03-31 DIAGNOSIS — M8580 Other specified disorders of bone density and structure, unspecified site: Secondary | ICD-10-CM

## 2024-03-31 DIAGNOSIS — M791 Myalgia, unspecified site: Secondary | ICD-10-CM

## 2024-03-31 MED ORDER — TIZANIDINE HCL 2 MG PO TABS: 2.0000 mg | ORAL_TABLET | Freq: Three times a day (TID) | ORAL | 1 refills | Status: DC | PRN

## 2024-03-31 NOTE — Progress Notes (Signed)
 Date:  03/31/2024   Name:  Jacqueline Matthews   DOB:  08-Aug-1941   MRN:  [id number]  Today's visit completed with assistance from Spanish interpreter.   Chief Complaint: Medical Management of Chronic Issues and Shoulder Pain (Right shoulder, can't lift arm up )  HPI Jacqueline Matthews returns to clinic to 4 months f/u on chronic conditions, joined by daughter [name] today, doing well overall but continues to complain of right shoulder pain limiting her activities and ability to complete daily tasks. Last XR Nov 2023 with mild arthritis. Says the smell of topical diclofenac  was very irritating/offensive to her. She is pretty sure she is still taking celecoxib , but she did not bring it with her other medications today.    Medication list has been reviewed and updated.  Current Meds  Medication Sig   acetaminophen  (TYLENOL ) 500 MG tablet Take 500-1,000 mg by mouth every 6 (six) hours as needed (for pain.).    alendronate (FOSAMAX) 70 MG tablet Take 70 mg by mouth once a week. Take with a full glass of water on an empty stomach.   celecoxib  (CELEBREX ) 200 MG capsule Take 1 capsule (200 mg total) by mouth daily as needed (for joint pain). Take with food.  Do not take with other NSAIDs (ibuprofen, naproxen, aspirin, etc.)   pravastatin  (PRAVACHOL ) 80 MG tablet Take 1 tablet (80 mg total) by mouth daily.   tiZANidine  (ZANAFLEX ) 2 MG tablet Take 1 tablet (2 mg total) by mouth every 8 (eight) hours as needed for muscle spasms.   VITAMIN D  PO Take 5,000 Int'l Units by mouth daily.   [DISCONTINUED] diclofenac  Sodium (VOLTAREN ) 1 % GEL Apply 2 g topically 4 (four) times daily as needed.     Review of Systems  Patient Active Problem List   Diagnosis Date Noted   Chronic right shoulder pain 04/01/2024   Vitamin D  deficiency 10/12/2023   Osteopenia after menopause 07/09/2023   Hyperlipidemia 07/09/2023   Generalized osteoarthritis of multiple sites 07/09/2023   Erosive osteoarthritis of right hand 07/09/2023     Allergies  Allergen Reactions   Meloxicam Nausea Only and Other (See Comments)    Upset stomach    Immunization History  Administered Date(s) Administered   Fluad Trivalent(High Dose 65+) 10/12/2023   Moderna Covid-19 Vaccine Bivalent Booster 8yrs & up 12/04/2021   Pneumococcal Conjugate-13 12/15/2014, 04/23/2015   Pneumococcal Polysaccharide-23 09/18/2009   Tdap 02/01/2014   Zoster Recombinant(Shingrix) 06/26/2014, 02/22/2021    Past Surgical History:  Procedure Laterality Date   CATARACT EXTRACTION W/PHACO Right 04/29/2023   Procedure: CATARACT EXTRACTION PHACO AND INTRAOCULAR LENS PLACEMENT (IOC) RIGHT 6.40 00:436.;  Surgeon: Annell Kidney, MD;  Location: [facility];  Service: Ophthalmology;  Laterality: Right;   CATARACT EXTRACTION W/PHACO Left 05/13/2023   Procedure: CATARACT EXTRACTION PHACO AND INTRAOCULAR LENS PLACEMENT (IOC) LEFT 6.64 00:33.4;  Surgeon: Annell Kidney, MD;  Location: [facility];  Service: Ophthalmology;  Laterality: Left;   CHOLECYSTECTOMY     COLONOSCOPY  2000   [place]   INGUINAL HERNIA REPAIR Right 12/30/2016   Procedure: HERNIA REPAIR INGUINAL ADULT;  Surgeon: Benancio Bracket, MD;  Location: [facility];  Service: General;  Laterality: Right;   VARICOSE VEIN SURGERY      Social History   Tobacco Use   Smoking status: Never   Smokeless tobacco: Never  Vaping Use   Vaping status: Never Used  Substance Use Topics   Alcohol use: Not Currently    Comment: rare   Drug  use: No    Family History  Problem Relation Age of Onset   Heart attack Father    Breast cancer Neg Hx         03/31/2024    2:54 PM 12/15/2023    1:33 PM 10/28/2023    3:59 PM 07/09/2023    9:44 AM  GAD 7 : Generalized Anxiety Score  Nervous, Anxious, on Edge 1 1 1  0  Control/stop worrying 0 0 0 0  Worry too much - different things 1 0 0 0  Trouble relaxing 1 0 0 0  Restless 1 0 0 1  Easily annoyed or irritable 0 0 0 0  Afraid - awful might  happen 0 0 0 0  Total GAD 7 Score 4 1 1 1   Anxiety Difficulty Not difficult at all Not difficult at all Not difficult at all Not difficult at all       03/31/2024    2:54 PM 12/15/2023    1:33 PM 10/28/2023    3:59 PM  Depression screen PHQ 2/9  Decreased Interest 0 0 0  Down, Depressed, Hopeless 0 0 0  PHQ - 2 Score 0 0 0  Altered sleeping   0  Tired, decreased energy   0  Change in appetite   0  Feeling bad or failure about yourself    0  Trouble concentrating   0  Moving slowly or fidgety/restless   0  Suicidal thoughts   0  PHQ-9 Score   0  Difficult doing work/chores   Not difficult at all    BP Readings from Last 3 Encounters:  03/31/24 114/64  12/15/23 110/82  10/28/23 138/74    Wt Readings from Last 3 Encounters:  03/31/24 117 lb (53.1 kg)  12/15/23 120 lb (54.4 kg)  10/28/23 118 lb (53.5 kg)    BP 114/64   Pulse 77   Temp 97.9 F (36.6 C)   Ht 4\' 11"  (1.499 m)   Wt 117 lb (53.1 kg)   SpO2 96%   BMI 23.63 kg/m   Physical Exam Vitals and nursing note reviewed.  Constitutional:      Appearance: Normal appearance.  Cardiovascular:     Rate and Rhythm: Normal rate.  Pulmonary:     Effort: Pulmonary effort is normal.  Abdominal:     General: There is no distension.  Musculoskeletal:        General: Normal range of motion.     Comments: Notable tension of the neck and trapezius R>L.  Right shoulder without significant tenderness to palpation, no musculoskeletal deformity.  AROM is significantly limited due to pain in flexion, abduction and internal rotation.  Positive empty can test on the right.  Skin:    General: Skin is warm and dry.  Neurological:     Mental Status: She is alert and oriented to person, place, and time.     Gait: Gait is intact.  Psychiatric:        Mood and Affect: Mood and affect normal.     Recent Labs     Component Value Date/Time   NA 142 12/15/2023 1404   K 5.0 12/15/2023 1404   CL 106 12/15/2023 1404   CO2 24  12/15/2023 1404   GLUCOSE 86 12/15/2023 1404   GLUCOSE 80 12/26/2016 1025   BUN 8 12/15/2023 1404   CREATININE 0.49 (L) 12/15/2023 1404   CALCIUM 10.4 (H) 12/15/2023 1404   PROT 7.0 12/15/2023 1404   ALBUMIN 4.5 12/15/2023 1404  AST 25 12/15/2023 1404   ALT 19 12/15/2023 1404   ALKPHOS 89 12/15/2023 1404   BILITOT <0.2 12/15/2023 1404   GFRNONAA >60 12/26/2016 1025   GFRAA >60 12/26/2016 1025    Lab Results  Component Value Date   WBC 8.0 07/09/2023   HGB 13.8 07/09/2023   HCT 43.3 07/09/2023   MCV 88 07/09/2023   PLT 294 07/09/2023   No results found for: "HGBA1C" Lab Results  Component Value Date   CHOL 181 12/15/2023   HDL 61 12/15/2023   LDLCALC 92 12/15/2023   TRIG 162 (H) 12/15/2023   CHOLHDL 3.0 12/15/2023   Lab Results  Component Value Date   TSH 2.500 07/09/2023     Assessment and Plan:  1. Chronic right shoulder pain (Primary) X-ray today essentially unchanged from previous November 2023, although there is some increase in degenerative changes around the [facility] joint.  Continue celecoxib  and proceed with physical therapy.  If no improvement after 4 to 6 weeks of physical therapy, plan for right shoulder MRI. - DG Shoulder Right - Ambulatory referral to Physical Therapy . 2. Muscle tension pain Will try low-dose tizanidine  for neck and shoulder tension, which is obvious on exam today.  Encouraged local heat and gentle stretching. - tiZANidine  (ZANAFLEX ) 2 MG tablet; Take 1 tablet (2 mg total) by mouth every 8 (eight) hours as needed for muscle spasms.  Dispense: 30 tablet; Refill: 1  3. Osteopenia after menopause Patient due for repeat DEXA July 2025, we will go ahead and place order today.  Continue alendronate as prescribed along with vitamin D  supplement. - DG Bone Density     Return in about 3 months (around 07/01/2024) for OV f/u chronic conditions.    Cody Das, PA-C, DMSc, Nutritionist [facility]  [facility] [phone]

## 2024-04-01 DIAGNOSIS — G8929 Other chronic pain: Secondary | ICD-10-CM | POA: Insufficient documentation

## 2024-04-01 NOTE — Progress Notes (Signed)
 Spoke with patient. Discussed results and instructions per provider. Patient verbalized understanding.

## 2024-04-13 ENCOUNTER — Ambulatory Visit: Payer: Self-pay | Admitting: Physician Assistant

## 2024-06-01 ENCOUNTER — Other Ambulatory Visit: Payer: Self-pay | Admitting: Physician Assistant

## 2024-06-01 DIAGNOSIS — M791 Myalgia, unspecified site: Secondary | ICD-10-CM

## 2024-06-03 NOTE — Telephone Encounter (Signed)
 Requested medications are due for refill today.  yes  Requested medications are on the active medications list.  yes  Last refill. 03/31/2024 #30 1 rf  Future visit scheduled.   yes  Notes to clinic.  Refill not delegated.    Requested Prescriptions  Pending Prescriptions Disp Refills   tiZANidine  (ZANAFLEX ) 2 MG tablet [Pharmacy Med Name: tiZANidine  HCl 2 MG Oral Tablet] 30 tablet 0    Sig: TAKE 1 TABLET BY MOUTH EVERY 8 HOURS AS NEEDED FOR MUSCLE SPASM     Not Delegated - Cardiovascular:  Alpha-2 Agonists - tizanidine  Failed - 06/03/2024  5:47 PM      Failed - This refill cannot be delegated      Passed - Valid encounter within last 6 months    Recent Outpatient Visits           2 months ago Chronic right shoulder pain   [facility] at [facility], [place], [place]

## 2024-07-01 ENCOUNTER — Ambulatory Visit: Admitting: Physician Assistant

## 2024-09-08 ENCOUNTER — Encounter: Payer: Self-pay | Admitting: Physician Assistant

## 2024-09-08 ENCOUNTER — Ambulatory Visit (INDEPENDENT_AMBULATORY_CARE_PROVIDER_SITE_OTHER): Admitting: Physician Assistant

## 2024-09-08 VITALS — BP 128/62 | HR 80 | Temp 97.7°F | Ht 59.0 in | Wt 118.0 lb

## 2024-09-08 DIAGNOSIS — Z23 Encounter for immunization: Secondary | ICD-10-CM

## 2024-09-08 DIAGNOSIS — R682 Dry mouth, unspecified: Secondary | ICD-10-CM

## 2024-09-08 DIAGNOSIS — M159 Polyosteoarthritis, unspecified: Secondary | ICD-10-CM

## 2024-09-08 DIAGNOSIS — E785 Hyperlipidemia, unspecified: Secondary | ICD-10-CM

## 2024-09-08 MED ORDER — PRAVASTATIN SODIUM 80 MG PO TABS
40.0000 mg | ORAL_TABLET | Freq: Every day | ORAL | Status: AC
Start: 2024-09-08 — End: ?

## 2024-09-08 NOTE — Progress Notes (Signed)
 Date:  09/08/2024   Name:  Jacqueline Matthews   DOB:  Feb 06, 1941   MRN:  [id number]   Chief Complaint: Medical Management of Chronic Issues, Arthritis (Still having pain, takes medication goes away some to help her sleep ), and Dryness (Dry mouth and nose, 2-3 months )  HPI  Jacqueline Matthews presents today joined by her daughter Jacqueline Matthews for routine follow up on chronic conditions including chronic pain of right shoulder and left knee. Celecoxib  helps some, but she asks about possibility for injection. She was referred to PT last time but this was never coordinated.   She is due for repeat DEXA. Osteopenia based on the last one. Not currently using alendronate.   Complains of dry mouth and lips for the last 2 months, worsening. Has upper dentures which she takes out and places in solution nightly. Brushes twice per day. Does not floss.   Medication list has been reviewed and updated.  Current Meds  Medication Sig   acetaminophen  (TYLENOL ) 500 MG tablet Take 500-1,000 mg by mouth every 6 (six) hours as needed (for pain.).    celecoxib  (CELEBREX ) 200 MG capsule Take 1 capsule (200 mg total) by mouth daily as needed (for joint pain). Take with food.  Do not take with other NSAIDs (ibuprofen, naproxen, aspirin, etc.)   Collagen-Vitamin C-Biotin (COLLAGEN PO) Take by mouth.   lidocaine  (HM LIDOCAINE  PATCH) 4 % Place 1 patch onto the skin daily as needed (for shoulder pain).   pravastatin  (PRAVACHOL ) 80 MG tablet Take 1 tablet (80 mg total) by mouth daily.   tiZANidine  (ZANAFLEX ) 2 MG tablet TAKE 1 TABLET BY MOUTH EVERY 8 HOURS AS NEEDED FOR MUSCLE SPASM   VITAMIN D  PO Take 5,000 Int'l Units by mouth daily.     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   Varicose veins of other specified sites 02/06/2021   Breast cyst, left 05/06/2020   Hypercalcemia 03/22/2020   Peripheral neuropathy 06/15/2019   Asthma 05/05/2018   Restless leg syndrome 05/05/2018   Posttraumatic stress disorder 01/01/2016   Anxiety 04/23/2015   Osteopenia 07/17/2009   Erosive osteoarthritis 07/17/2009    Allergies  Allergen Reactions   Meloxicam Nausea Only and Other (See Comments)    Upset stomach    Immunization History  Administered Date(s) Administered   Fluad Trivalent(High Dose 65+) 10/12/2023   Fluzone Influenza virus vaccine,trivalent (IIV3), split virus 09/23/2013   INFLUENZA, HIGH DOSE SEASONAL PF 01/01/2016, 11/12/2016, 09/17/2017, 10/13/2018, 09/08/2024   Influenza-Unspecified 09/18/2009, 09/07/2019, 08/21/2021   Moderna Covid-19 Vaccine Bivalent Booster 33yrs & up 12/04/2021   Pneumococcal Conjugate-13 12/15/2014, 04/23/2015   Pneumococcal Polysaccharide-23 09/18/2009   Tdap 02/01/2014   Zoster Recombinant(Shingrix) 06/26/2014, 02/22/2021   Zoster, Live 06/26/2014    Past Surgical History:  Procedure Laterality Date   CATARACT EXTRACTION W/PHACO Right 04/29/2023   Procedure: CATARACT EXTRACTION PHACO AND INTRAOCULAR LENS PLACEMENT (IOC) RIGHT 6.40 00:436.;  Surgeon: Mittie Gaskin, MD;  Location: [facility];  Service: Ophthalmology;  Laterality: Right;   CATARACT EXTRACTION W/PHACO Left 05/13/2023   Procedure: CATARACT EXTRACTION PHACO AND INTRAOCULAR LENS PLACEMENT (IOC) LEFT 6.64 00:33.4;  Surgeon: Mittie Gaskin, MD;  Location: [facility];  Service:  Ophthalmology;  Laterality: Left;   CHOLECYSTECTOMY     COLONOSCOPY  2000   [place]   INGUINAL HERNIA REPAIR Right 12/30/2016   Procedure: HERNIA REPAIR INGUINAL ADULT;  Surgeon: Larinda Unknown Sharps, MD;  Location: [facility];  Service: General;  Laterality: Right;   VARICOSE VEIN SURGERY       Social History   Tobacco Use   Smoking status: Never   Smokeless tobacco: Never  Vaping Use   Vaping status: Never Used  Substance Use Topics   Alcohol use: Not Currently    Comment: rare   Drug use: No    Family History  Problem Relation Age of Onset   Heart attack Father    Breast cancer Neg Hx         09/08/2024   10:35 AM 03/31/2024    2:54 PM 12/15/2023    1:33 PM 10/28/2023    3:59 PM  GAD 7 : Generalized Anxiety Score  Nervous, Anxious, on Edge 0 1 1 1   Control/stop worrying 0 0 0 0  Worry too much - different things 0 1 0 0  Trouble relaxing 0 1 0 0  Restless 0 1 0 0  Easily annoyed or irritable 0 0 0 0  Afraid - awful might happen 0 0 0 0  Total GAD 7 Score 0 4 1 1   Anxiety Difficulty Not difficult at all Not difficult at all Not difficult at all Not difficult at all       09/08/2024   10:34 AM 03/31/2024    2:54 PM 12/15/2023    1:33 PM  Depression screen PHQ 2/9  Decreased Interest 0 0 0  Down, Depressed, Hopeless 0 0 0  PHQ - 2 Score 0 0 0    BP Readings from Last 3 Encounters:  09/08/24 128/62  03/31/24 114/64  12/15/23 110/82    Wt Readings from Last 3 Encounters:  09/08/24 118 lb (53.5 kg)  03/31/24 117 lb (53.1 kg)  12/15/23 120 lb (54.4 kg)    BP 128/62   Pulse 80   Temp 97.7 F (36.5 C)   Ht 4' 11 (1.499 m)   Wt 118 lb (53.5 kg)   SpO2 95%   BMI 23.83 kg/m   Physical Exam Vitals and nursing note reviewed.  Constitutional:      Appearance: Normal appearance.  HENT:     Mouth/Throat:     Dentition: Has dentures (upper). Gingival swelling present.  Cardiovascular:     Rate and Rhythm: Normal rate and regular rhythm.     Heart sounds: No murmur heard.    No friction rub. No gallop.  Pulmonary:     Effort: Pulmonary effort is normal.     Breath sounds: Normal breath sounds.  Abdominal:     General: There is no distension.  Musculoskeletal:        General: Normal range of motion.  Skin:    General: Skin is warm and  dry.  Neurological:     Mental Status: She is alert and oriented to person, place, and time.     Gait: Gait is intact.  Psychiatric:        Mood and Affect: Mood and affect normal.     Recent Labs     Component Value Date/Time   NA 142 12/15/2023 1404   K 5.0 12/15/2023 1404   CL 106 12/15/2023 1404   CO2 24 12/15/2023 1404   GLUCOSE 86 12/15/2023 1404   GLUCOSE 80 12/26/2016 1025  BUN 8 12/15/2023 1404   CREATININE 0.49 (L) 12/15/2023 1404   CALCIUM 10.4 (H) 12/15/2023 1404   PROT 7.0 12/15/2023 1404   ALBUMIN 4.5 12/15/2023 1404   AST 25 12/15/2023 1404   ALT 19 12/15/2023 1404   ALKPHOS 89 12/15/2023 1404   BILITOT <0.2 12/15/2023 1404   GFRNONAA >60 12/26/2016 1025   GFRAA >60 12/26/2016 1025    Lab Results  Component Value Date   WBC 8.0 07/09/2023   HGB 13.8 07/09/2023   HCT 43.3 07/09/2023   MCV 88 07/09/2023   PLT 294 07/09/2023   No results found for: HGBA1C Lab Results  Component Value Date   CHOL 181 12/15/2023   HDL 61 12/15/2023   LDLCALC 92 12/15/2023   TRIG 162 (H) 12/15/2023   CHOLHDL 3.0 12/15/2023   Lab Results  Component Value Date   TSH 2.500 07/09/2023      Assessment and Plan:  1. Generalized osteoarthritis of multiple sites (Primary) Patient given number for PT. Also will arrange consult with my colleague Dr. Selinda Ku for possible right shoulder injection. Continue celecoxib . Consider dose reduction of pravastatin  in the absence of serious CV risk factors...will have patient try half tablets (40 mg dose) until next lipid check.   2. Hypercalcemia - Basic metabolic panel with GFR  3. Dry mouth Discussed multiple possible etiologies including allergies, indoor heat with colder weather, or bacterial overgrowth. Encouraged continued oral hygiene including flossing on the lower teeth which show signs of gingival disease today. Also recommended OTC Therabreath Dry Mouth lozenges PRN.   4. Encounter for immunization - Flu  vaccine HIGH DOSE PF(Fluzone Trivalent)  5. Hyperlipidemia, unspecified hyperlipidemia type Will have patient try half tablets (40 mg dose) until next lipid check.  - pravastatin  (PRAVACHOL ) 80 MG tablet; Take 0.5 tablets (40 mg total) by mouth daily.    No follow-ups on file.    Rolan Hoyle, PA-C, DMSc, Nutritionist [facility] [phone]

## 2024-09-09 ENCOUNTER — Encounter: Admitting: Family Medicine

## 2024-09-09 ENCOUNTER — Ambulatory Visit: Payer: Self-pay | Admitting: Physician Assistant

## 2024-09-09 ENCOUNTER — Other Ambulatory Visit: Payer: Self-pay | Admitting: Physician Assistant

## 2024-09-09 LAB — BASIC METABOLIC PANEL WITH GFR
BUN/Creatinine Ratio: 19 (ref 12–28)
BUN: 12 mg/dL (ref 8–27)
CO2: 24 mmol/L (ref 20–29)
Calcium: 10.5 mg/dL — ABNORMAL HIGH (ref 8.7–10.3)
Chloride: 105 mmol/L (ref 96–106)
Creatinine, Ser: 0.62 mg/dL (ref 0.57–1.00)
Glucose: 86 mg/dL (ref 70–99)
Potassium: 5 mmol/L (ref 3.5–5.2)
Sodium: 143 mmol/L (ref 134–144)
eGFR: 88 mL/min/1.73 (ref >59)

## 2024-09-13 ENCOUNTER — Ambulatory Visit (INDEPENDENT_AMBULATORY_CARE_PROVIDER_SITE_OTHER): Admitting: Family Medicine

## 2024-09-13 ENCOUNTER — Encounter: Payer: Self-pay | Admitting: Family Medicine

## 2024-09-13 DIAGNOSIS — M159 Polyosteoarthritis, unspecified: Secondary | ICD-10-CM | POA: Diagnosis not present

## 2024-09-13 MED ORDER — CELECOXIB 200 MG PO CAPS: 200.0000 mg | ORAL_CAPSULE | Freq: Every day | ORAL | 0 refills | Status: AC | PRN

## 2024-09-13 NOTE — Assessment & Plan Note (Signed)
 History of Present Illness Jacqueline Matthews is an 83 year old RHD female with arthritis and osteoporosis who presents with right shoulder pain. She is accompanied by her daughter.  Right shoulder pain - Constant pain for the past six months - Exacerbated by lifting the arm - Significantly disrupts sleep, especially when lying on the right side - No radiation of pain past the elbow - Recent onset of new pain localized to the anterior aspect of the shoulder - No prior history of shoulder pain before the last six months  Functional impact - Difficulty sleeping due to pain, particularly when lying on the right side  Imaging findings - Right shoulder x-rays in May showed evidence of arthritis and changes in the subacromial space - Ball and socket joint appeared to be in good condition  Pharmacologic management - Currently using Tylenol  for pain relief - Has a prescription for Celebrex  (celecoxib ), but unclear if taken regularly - Uses tizanidine  for muscle-related symptoms  Physical Exam PALPATION: Right subacromial space non-tender, tenderness at bicipital groove and right AC joint, non-tender at right upper trapezius. RANGE OF MOTION: Right shoulder forward flexion limited to 15 degrees and abduction limited to 45 degrees by pain and weakness. STRENGTH: Right shoulder external and internal rotation 5/5 strength, painless. SPECIAL TESTS: Right supraspinatus testing shows maximal discomfort and 4/5 strength. Positive Neer's, Hawkins', and Speed's tests on the right.  Assessment and Plan Right shoulder rotator cuff tear concern with biceps tendinopathy and osteoarthritis Acute on chronic shoulder pain with concern for rotator cuff tear and currently evident biceps tendinopathy. X-rays show mild subacromial degenerative changes. She wishes to proceed non-surgically. - Prescribed celecoxib  200 mg daily with food for one month. - Advised to monitor for stomach pain and discontinue if it  occurs. - Scheduled follow-up in 1-2 weeks to assess response to celecoxib . - Consider corticosteroid injection if pain persists despite medication.

## 2024-09-13 NOTE — Patient Instructions (Signed)
 VISIT SUMMARY:  Today, we discussed your ongoing right shoulder pain, which has been affecting your sleep and daily activities. We reviewed your recent x-rays and discussed your current medications and treatment options.  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 -Take celecoxib  200 mg daily with food for one month. -Monitor for any stomach pain and stop taking celecoxib  if it occurs. -Continue using Tylenol  as needed for pain relief. -We will follow up in 1-2 weeks to see how you are responding to the medication. -If the pain continues, we may consider a corticosteroid injection.

## 2024-09-13 NOTE — Progress Notes (Signed)
 Primary Care / Sports Medicine Office Visit  Patient Information:  Patient ID: Jacqueline Matthews, female DOB: 12/14/1940 Age: 83 y.o. MRN: [id number]   Jacqueline Matthews is a pleasant 83 y.o. female presenting with the following:  Chief Complaint  Patient presents with   Shoulder Pain    Right shoulder pain x 6 months. Pain is constant. Patient has weakness in right arm, she can't lift her right arm without assistance. She takes tylenol  for the pain and also taking Tizanidine . She does not have xray's for today's visit.     Vitals:   09/13/24 0932  BP: 108/60  Pulse: 81  SpO2: 94%   Vitals:   09/13/24 0932  Weight: 119 lb (54 kg)  Height: 4' 11 (1.499 m)   Body mass index is 24.04 kg/m.  No results found.   Discussed the use of AI scribe software for clinical note transcription with the patient, who gave verbal consent to proceed.   Independent interpretation of notes and tests performed by another provider:   Right shoulder X-ray: Mild arthritis in the glenohumeral joint, cortical roughening at superior humeral head and inferior acromion, no malalignment, mild arthritis at the acromioclavicular joint, otherwise preserved joint space at the glenohumeral joint. (03/2024)  Procedures performed:   None  Pertinent History, Exam, Impression, and Recommendations:   Problem List Items Addressed This Visit     Generalized osteoarthritis of multiple sites   History of Present Illness Jacqueline Matthews is an 83 year old RHD female with arthritis and osteoporosis who presents with right shoulder pain. She is accompanied by her daughter.  Right shoulder pain - Constant pain for the past six months - Exacerbated by lifting the arm - Significantly disrupts sleep, especially when lying on the right side - No radiation of pain past the elbow - Recent onset of new pain localized to the anterior aspect of the shoulder - No prior history of shoulder pain before the last six  months  Functional impact - Difficulty sleeping due to pain, particularly when lying on the right side  Imaging findings - Right shoulder x-rays in May showed evidence of arthritis and changes in the subacromial space - Ball and socket joint appeared to be in good condition  Pharmacologic management - Currently using Tylenol  for pain relief - Has a prescription for Celebrex  (celecoxib ), but unclear if taken regularly - Uses tizanidine  for muscle-related symptoms  Physical Exam PALPATION: Right subacromial space non-tender, tenderness at bicipital groove and right AC joint, non-tender at right upper trapezius. RANGE OF MOTION: Right shoulder forward flexion limited to 15 degrees and abduction limited to 45 degrees by pain and weakness. STRENGTH: Right shoulder external and internal rotation 5/5 strength, painless. SPECIAL TESTS: Right supraspinatus testing shows maximal discomfort and 4/5 strength. Positive Neer's, Hawkins', and Speed's tests on the right.  Assessment and Plan Right shoulder rotator cuff tear concern with biceps tendinopathy and osteoarthritis Acute on chronic shoulder pain with concern for rotator cuff tear and currently evident biceps tendinopathy. X-rays show mild subacromial degenerative changes. She wishes to proceed non-surgically. - Prescribed celecoxib  200 mg daily with food for one month. - Advised to monitor for stomach pain and discontinue if it occurs. - Scheduled follow-up in 1-2 weeks to assess response to celecoxib . - Consider corticosteroid injection if pain persists despite medication.      Relevant Medications   celecoxib  (CELEBREX ) 200 MG capsule     Orders & Medications Medications:  Meds ordered this encounter  Medications  celecoxib  (CELEBREX ) 200 MG capsule    Sig: Take 1 capsule (200 mg total) by mouth daily as needed (for joint pain). Take with food.  Do not take with other NSAIDs (ibuprofen, naproxen, aspirin, etc.)    Dispense:  30  capsule    Refill:  0   No orders of the defined types were placed in this encounter.    Return for ORTHO f/u 1-2 weeks, 40 min procedure visit right shoulder.     Selinda JINNY Ku, MD, [facility]   Primary Care Sports Medicine Primary Care and Sports Medicine at [facility]

## 2024-09-15 LAB — PARATHYROID HORMONE, INTACT (NO CA): PTH: 74 pg/mL — ABNORMAL HIGH (ref 15–65)

## 2024-09-16 ENCOUNTER — Ambulatory Visit: Payer: Self-pay | Admitting: Physician Assistant

## 2024-09-16 DIAGNOSIS — E213 Hyperparathyroidism, unspecified: Secondary | ICD-10-CM | POA: Insufficient documentation

## 2024-09-27 ENCOUNTER — Ambulatory Visit: Admitting: Family Medicine

## 2024-11-01 ENCOUNTER — Other Ambulatory Visit: Payer: Self-pay | Admitting: Physician Assistant

## 2024-11-01 DIAGNOSIS — M791 Myalgia, unspecified site: Secondary | ICD-10-CM

## 2024-11-03 NOTE — Telephone Encounter (Signed)
 Requested medications are due for refill today.  yes  Requested medications are on the active medications list.  yes  Last refill. 06/06/2024 #30 2 rf  Future visit scheduled.   yes  Notes to clinic.  Refill not delegated    Requested Prescriptions  Pending Prescriptions Disp Refills   tiZANidine  (ZANAFLEX ) 2 MG tablet [Pharmacy Med Name: tiZANidine  HCl 2 MG Oral Tablet] 30 tablet 0    Sig: TAKE 1 TABLET BY MOUTH EVERY 8 HOURS AS NEEDED FOR MUSCLE SPASM     Not Delegated - Cardiovascular:  Alpha-2 Agonists - tizanidine  Failed - 11/03/2024  5:10 PM      Failed - This refill cannot be delegated      Passed - Valid encounter within last 6 months    Recent Outpatient Visits           1 month ago Generalized osteoarthritis of multiple sites   [facility] & [facility] at [facility], Selinda PARAS, [place]   1 month ago Generalized osteoarthritis of multiple sites   [facility] Primary Care & Sports Medicine at [facility], Tor[place]io SQUIBB, [place]   7 months ago Chronic right shoulder pain   [facility] Primary Care & Sports Medicine at [facility], Toribio SQUIBB, [place]

## 2024-11-15 ENCOUNTER — Ambulatory Visit
Admission: RE | Admit: 2024-11-15 | Discharge: 2024-11-15 | Disposition: A | Source: Ambulatory Visit | Attending: Physician Assistant | Admitting: Physician Assistant

## 2024-11-15 ENCOUNTER — Ambulatory Visit: Payer: Self-pay | Admitting: Physician Assistant

## 2024-11-15 DIAGNOSIS — Z780 Asymptomatic menopausal state: Secondary | ICD-10-CM | POA: Insufficient documentation

## 2024-11-15 DIAGNOSIS — M8580 Other specified disorders of bone density and structure, unspecified site: Secondary | ICD-10-CM | POA: Diagnosis present

## 2024-11-29 DIAGNOSIS — S3011XA Contusion of abdominal wall, initial encounter: Secondary | ICD-10-CM

## 2024-11-29 DIAGNOSIS — R079 Chest pain, unspecified: Secondary | ICD-10-CM | POA: Diagnosis not present

## 2024-11-29 DIAGNOSIS — S0990XA Unspecified injury of head, initial encounter: Secondary | ICD-10-CM | POA: Diagnosis present

## 2024-11-29 DIAGNOSIS — S161XXA Strain of muscle, fascia and tendon at neck level, initial encounter: Secondary | ICD-10-CM

## 2024-11-29 DIAGNOSIS — M25511 Pain in right shoulder: Secondary | ICD-10-CM | POA: Insufficient documentation

## 2024-11-29 DIAGNOSIS — Y92410 Unspecified street and highway as the place of occurrence of the external cause: Secondary | ICD-10-CM | POA: Diagnosis not present

## 2024-11-29 DIAGNOSIS — S20219A Contusion of unspecified front wall of thorax, initial encounter: Secondary | ICD-10-CM

## 2024-11-29 DIAGNOSIS — R109 Unspecified abdominal pain: Secondary | ICD-10-CM | POA: Diagnosis not present

## 2024-11-29 LAB — CBC WITH DIFFERENTIAL/PLATELET
Abs Immature Granulocytes: 0.02 K/uL (ref 0.00–0.07)
Basophils Absolute: 0.1 K/uL (ref 0.0–0.1)
Basophils Relative: 1 %
Eosinophils Absolute: 0.1 K/uL (ref 0.0–0.5)
Eosinophils Relative: 2 %
HCT: 42 % (ref 36.0–46.0)
Hemoglobin: 13.3 g/dL (ref 12.0–15.0)
Immature Granulocytes: 0 %
Lymphocytes Relative: 28 %
Lymphs Abs: 2.1 K/uL (ref 0.7–4.0)
MCH: 27.8 pg (ref 26.0–34.0)
MCHC: 31.7 g/dL (ref 30.0–36.0)
MCV: 87.7 fL (ref 80.0–100.0)
Monocytes Absolute: 0.7 K/uL (ref 0.1–1.0)
Monocytes Relative: 9 %
Neutro Abs: 4.6 K/uL (ref 1.7–7.7)
Neutrophils Relative %: 60 %
Platelets: 296 K/uL (ref 150–400)
RBC: 4.79 MIL/uL (ref 3.87–5.11)
RDW: 15.8 % — ABNORMAL HIGH (ref 11.5–15.5)
WBC: 7.5 K/uL (ref 4.0–10.5)
nRBC: 0 % (ref 0.0–0.2)

## 2024-11-29 LAB — COMPREHENSIVE METABOLIC PANEL WITH GFR
ALT: 17 U/L (ref 0–44)
AST: 26 U/L (ref 15–41)
Albumin: 4.4 g/dL (ref 3.5–5.0)
Alkaline Phosphatase: 85 U/L (ref 38–126)
Anion gap: 12 (ref 5–15)
BUN: 13 mg/dL (ref 8–23)
CO2: 24 mmol/L (ref 22–32)
Calcium: 10.2 mg/dL (ref 8.9–10.3)
Chloride: 107 mmol/L (ref 98–111)
Creatinine, Ser: 0.91 mg/dL (ref 0.44–1.00)
GFR, Estimated: >60 mL/min
Glucose, Bld: 89 mg/dL (ref 70–99)
Potassium: 4.1 mmol/L (ref 3.5–5.1)
Sodium: 144 mmol/L (ref 135–145)
Total Bilirubin: 0.3 mg/dL (ref 0.0–1.2)
Total Protein: 6.9 g/dL (ref 6.5–8.1)

## 2024-11-29 MED ADMIN — Iohexol Inj 300 MG/ML: 100 mL | INTRAVENOUS | NDC 00407141363
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
## 2024-11-29 NOTE — ED Notes (Signed)
 ED Provider at bedside.

## 2024-11-30 NOTE — Discharge Instructions (Signed)
 Take Tylenol  1000 mg every 8 hours as needed for pain.  Rest.  Return to the emergency department if you develop any new and/or concerning issues.

## 2024-11-30 NOTE — ED Notes (Signed)
 ED Provider at bedside.
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
## 2025-01-09 ENCOUNTER — Ambulatory Visit: Admitting: Physician Assistant
